# Patient Record
Sex: Female | Born: 1990
Health system: Southern US, Community
[De-identification: ages and names within clinical notes are randomized; demographics above are authoritative.]

## PROBLEM LIST (undated history)

## (undated) DIAGNOSIS — F411 Generalized anxiety disorder: Secondary | ICD-10-CM

## (undated) DIAGNOSIS — T7840XA Allergy, unspecified, initial encounter: Secondary | ICD-10-CM

## (undated) DIAGNOSIS — J45990 Exercise induced bronchospasm: Secondary | ICD-10-CM

## (undated) DIAGNOSIS — N946 Dysmenorrhea, unspecified: Secondary | ICD-10-CM

## (undated) DIAGNOSIS — F909 Attention-deficit hyperactivity disorder, unspecified type: Secondary | ICD-10-CM

## (undated) HISTORY — DX: Allergy, unspecified, initial encounter: T78.40XA

## (undated) HISTORY — PX: TYMPANOSTOMY: SHX2586

## (undated) HISTORY — PX: OTHER SURGICAL HISTORY: SHX169

## (undated) HISTORY — DX: Attention-deficit hyperactivity disorder, unspecified type: F90.9

## (undated) HISTORY — PX: ADENOIDECTOMY: SUR15

## (undated) HISTORY — DX: Dysmenorrhea, unspecified: N94.6

## (undated) HISTORY — DX: Exercise induced bronchospasm: J45.990

## (undated) HISTORY — DX: Generalized anxiety disorder: F41.1

## (undated) HISTORY — PX: FRACTURE SURGERY: SHX138

---
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
## 2013-02-04 ENCOUNTER — Encounter (HOSPITAL_COMMUNITY): Payer: Self-pay

## 2013-02-04 ENCOUNTER — Emergency Department (HOSPITAL_COMMUNITY)
Admission: EM | Admit: 2013-02-04 | Discharge: 2013-02-04 | Disposition: A | Discharge status: Home / Self Care | Payer: Self-pay | Source: Home / Self Care

## 2013-02-04 ENCOUNTER — Emergency Department (INDEPENDENT_AMBULATORY_CARE_PROVIDER_SITE_OTHER): Payer: 59

## 2013-02-04 DIAGNOSIS — R0789 Other chest pain: Secondary | ICD-10-CM

## 2013-02-04 DIAGNOSIS — R071 Chest pain on breathing: Secondary | ICD-10-CM

## 2013-02-04 DIAGNOSIS — M940 Chondrocostal junction syndrome [Tietze]: Secondary | ICD-10-CM

## 2013-02-04 MED ORDER — HYDROCODONE-ACETAMINOPHEN 5-325 MG PO TABS: 1.0000 | ORAL_TABLET | ORAL | Status: DC | PRN
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
## 2013-02-06 NOTE — ED Provider Notes (Signed)
Medical screening examination/treatment/procedure(s) were performed by non-physician practitioner and as supervising physician I was immediately available for consultation/collaboration.  Haralambos Yeatts, M.D.  Analiyah Lechuga C Tiny Chaudhary, MD 02/06/13 [id number] 

## 2013-06-15 ENCOUNTER — Ambulatory Visit (INDEPENDENT_AMBULATORY_CARE_PROVIDER_SITE_OTHER): Payer: 59 | Admitting: Family

## 2013-06-15 ENCOUNTER — Encounter: Payer: Self-pay | Admitting: Family

## 2013-06-15 VITALS — BP 120/80 | HR 69 | Ht 67.0 in | Wt 143.0 lb

## 2013-06-15 DIAGNOSIS — Z0000 Encounter for general adult medical examination without abnormal findings: Secondary | ICD-10-CM

## 2013-06-15 LAB — COMPREHENSIVE METABOLIC PANEL
Albumin: 4 g/dL (ref 3.5–5.2)
Alkaline Phosphatase: 44 U/L (ref 39–117)
BUN: 15 mg/dL (ref 6–23)
CO2: 27 mEq/L (ref 19–32)
GFR: 91.42 mL/min (ref >60.00)
Glucose, Bld: 83 mg/dL (ref 70–99)
Total Bilirubin: 0.7 mg/dL (ref 0.3–1.2)

## 2013-06-15 LAB — CBC WITH DIFFERENTIAL/PLATELET
Basophils Relative: 0.6 % (ref 0.0–3.0)
Eosinophils Absolute: 0.1 10*3/uL (ref 0.0–0.7)
Eosinophils Relative: 2.5 % (ref 0.0–5.0)
HCT: 38.4 % (ref 36.0–46.0)
Hemoglobin: 12.7 g/dL (ref 12.0–15.0)
MCHC: 33.1 g/dL (ref 30.0–36.0)
MCV: 89 fl (ref 78.0–100.0)
Monocytes Absolute: 0.4 10*3/uL (ref 0.1–1.0)
Neutro Abs: 2.2 10*3/uL (ref 1.4–7.7)
RBC: 4.31 Mil/uL (ref 3.87–5.11)

## 2013-06-15 LAB — POCT URINALYSIS DIPSTICK
Bilirubin, UA: NEGATIVE
Blood, UA: NEGATIVE
Glucose, UA: NEGATIVE
Ketones, UA: NEGATIVE
Spec Grav, UA: 1.015
Urobilinogen, UA: 0.2

## 2013-06-15 NOTE — Progress Notes (Signed)
  Subjective:    Patient ID: Katherine Wise, female    DOB: Sep 26, 1991, 22 y.o.   MRN: [id number]  HPI 22 year old white female, a patient it practices in for complete physical exam. Denies any concerns. 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 She is considering going into [employer]. She sees gynecology for Pap and pelvic exams.   Review of Systems  Constitutional: Negative.   HENT: Negative.   Eyes: Negative.   Respiratory: Negative.   Cardiovascular: Negative.   Gastrointestinal: Negative.   Endocrine: Negative.   Genitourinary: Negative.   Musculoskeletal: Negative.   Skin: Negative.   Allergic/Immunologic: Negative.   Neurological: Negative.   Hematological: Negative.   Psychiatric/Behavioral: Negative.    History reviewed. No pertinent past medical history.  History   Social History  . Marital Status: Single    Spouse Name: N/A    Number of Children: N/A  . Years of Education: N/A   Occupational History  . Not on file.   Social History Main Topics  . Smoking status: Never Smoker   . Smokeless tobacco: Not on file  . Alcohol Use: Yes  . Drug Use: No  . Sexually Active: Not on file   Other Topics Concern  . Not on file   Social History Narrative  . No narrative on file    Past Surgical History  Procedure Laterality Date  . Adenoidectomy    . Ankle sugery      Family History  Problem Relation Age of Onset  . Cancer Maternal Aunt     breast  . Cancer Maternal Grandmother     breast    No Known Allergies  Current Outpatient Prescriptions on File Prior to Visit  Medication Sig Dispense Refill  . HYDROcodone-acetaminophen (NORCO/VICODIN) 5-325 MG per tablet Take 1 tablet by mouth every 4 (four) hours as needed for pain.  10 tablet  0   No current facility-administered medications on file prior to visit.    BP 120/80  Pulse 69  Ht 5\' 7"  (1.702 m)  Wt 143 lb (64.864 kg)  BMI 22.39 kg/m2  SpO2  98%chart    Objective:   Physical Exam  Constitutional: She is oriented to person, place, and time. She appears well-developed and well-nourished.  HENT:  Head: Normocephalic.  Right Ear: External ear normal.  Left Ear: External ear normal.  Nose: Nose normal.  Mouth/Throat: Oropharynx is clear and moist.  Eyes: Conjunctivae are normal. Pupils are equal, round, and reactive to light.  Neck: Normal range of motion. Neck supple. No thyromegaly present.  Cardiovascular: Normal rate, regular rhythm and normal heart sounds.   Pulmonary/Chest: Effort normal and breath sounds normal.  Abdominal: Soft. Bowel sounds are normal.  Musculoskeletal: Normal range of motion.  Neurological: She is alert and oriented to person, place, and time. She has normal reflexes.  Skin: Skin is warm and dry.  Psychiatric: She has a normal mood and affect.          Assessment & Plan:  Assessment: 1. Complete physical exam  Plan: I asked him to include CBC, TSH, BMP, urine poc. Call the office with any questions of concerns. Recheck pending labs and sooner as needed

## 2013-06-15 NOTE — Patient Instructions (Signed)
Breast Self-Examination You should begin examining your breasts at age 20 even though the risk for breast cancer is low in this age group. It is important to become familiar with how your breasts look and feel. This is true for pregnant women, nursing mothers, women in menopause and women who have breast implants.  Women should examine their breasts once a month to look for changes and lumps. By doing monthly breast exams, you get to know how your breasts feel and how they can change from month to month. This allows you to pick up changes early. It can also offer you some reassurance that your breast health is good. This exam only takes minutes. Most breast lumps are not caused by cancer. If you find a lump, a special x-ray called a mammogram, or other tests may be needed to determine what is wrong.  Some of the signs that a breast lump is caused by cancer include:  Dimpling of the skin or changes in the shape of the breast or nipple.   A dark-colored or bloody discharge from the nipple.   Swollen lymph glands around the breast or in the armpit.   Redness of the breast or nipple.   Scaly nipple or skin on the breast.   Pain or swelling of the breast.  SELF-EXAM There are a few points to follow when doing a thorough breast exam. The best time to examine your breasts is 5 to 7 days after the menstrual period is over. During menstruation, the breasts are lumpier, and it may be more difficult to pick up changes. If you do not menstruate, have reached menopause or had a hysterectomy, examine your breasts the first day of every month. After three to four months, you will become more familiar with the variations of your breasts and more comfortable with the exam.  Perform your breast exam monthly. Keep a written record with breast changes or normal findings for each breast. This makes it easier to be sure of changes and to not solely depend on memory for size, tenderness, or location. Try to do the exam  at the same time each month, and write down where you are in your menstrual cycle if you are still menstruating.   Look at your breasts. Stand in front of a mirror with your hands clasped behind your head. Tighten your chest muscles and look for asymmetry. This means a difference in shape or contour from one breast to the other, such as puckers, dips or bumps. Look also for skin changes.   Lean forward with your hands on your hips. Again, look for symmetry and skin changes.   While showering, soap the breasts, and carefully feel the breasts with fingertips while holding the arm (on the side of the breast being examined) over the head. Do this with each breast carefully feeling for lumps or changes. Typically, a circular motion with moderate fingertip pressure should be used.   Repeat this exam while lying on your back, again with your arm behind your head and a pillow under your shoulders. Again, use your fingertips to examine both breasts, feeling for lumps and thickening. Begin at 1 o'clock and go clockwise around the whole breast.   At the end of your exam, gently squeeze each nipple to see if there is any drainage. Look for nipple changes, dimpling or redness.   Lastly, examine the upper chest and clavicle areas and in your armpits.  It is not necessary to become alarmed if you find   a breast lump. Most of them are not cancerous. However, it is necessary to see your caregiver if a lump is found in order to have it looked at. Document Released: 12/16/2004 Document Revised: 07/21/2011 Document Reviewed: 02/25/2009 ExitCare Patient Information 2012 [employer]. 

## 2013-09-25 ENCOUNTER — Ambulatory Visit (INDEPENDENT_AMBULATORY_CARE_PROVIDER_SITE_OTHER): Payer: 59

## 2013-09-25 DIAGNOSIS — Z23 Encounter for immunization: Secondary | ICD-10-CM

## 2013-09-25 DIAGNOSIS — Z111 Encounter for screening for respiratory tuberculosis: Secondary | ICD-10-CM

## 2013-09-26 ENCOUNTER — Other Ambulatory Visit: Payer: Self-pay | Admitting: Family

## 2013-09-26 DIAGNOSIS — Z20828 Contact with and (suspected) exposure to other viral communicable diseases: Secondary | ICD-10-CM

## 2013-09-27 ENCOUNTER — Other Ambulatory Visit (INDEPENDENT_AMBULATORY_CARE_PROVIDER_SITE_OTHER): Payer: 59

## 2013-09-27 ENCOUNTER — Encounter: Payer: Self-pay | Admitting: Family

## 2013-09-27 ENCOUNTER — Ambulatory Visit: Payer: 59

## 2013-09-27 DIAGNOSIS — Z20828 Contact with and (suspected) exposure to other viral communicable diseases: Secondary | ICD-10-CM

## 2013-09-27 DIAGNOSIS — Z23 Encounter for immunization: Secondary | ICD-10-CM

## 2013-09-27 LAB — TB SKIN TEST
Induration: 0 mm
TB Skin Test: NEGATIVE

## 2013-09-28 LAB — MEASLES/MUMPS/RUBELLA IMMUNITY
Mumps IgG: 5.79 AU/mL (ref <9.00)
Rubella: 4.66 Index — ABNORMAL HIGH (ref <0.90)

## 2013-09-28 LAB — VARICELLA ZOSTER ANTIBODY, IGM: Varicella Zoster Ab IgM: 0.07 {ISR} (ref <0.91)

## 2013-10-11 ENCOUNTER — Ambulatory Visit (INDEPENDENT_AMBULATORY_CARE_PROVIDER_SITE_OTHER): Payer: 59

## 2013-10-11 ENCOUNTER — Ambulatory Visit: Payer: 59

## 2013-10-11 DIAGNOSIS — Z23 Encounter for immunization: Secondary | ICD-10-CM

## 2013-10-22 ENCOUNTER — Ambulatory Visit (INDEPENDENT_AMBULATORY_CARE_PROVIDER_SITE_OTHER): Payer: 59

## 2013-10-22 DIAGNOSIS — Z111 Encounter for screening for respiratory tuberculosis: Secondary | ICD-10-CM

## 2013-10-24 LAB — TB SKIN TEST: TB Skin Test: NEGATIVE

## 2013-11-09 ENCOUNTER — Ambulatory Visit (INDEPENDENT_AMBULATORY_CARE_PROVIDER_SITE_OTHER): Payer: 59

## 2013-11-09 DIAGNOSIS — Z23 Encounter for immunization: Secondary | ICD-10-CM

## 2014-01-16 ENCOUNTER — Ambulatory Visit: Payer: 59 | Admitting: Family

## 2014-01-17 ENCOUNTER — Ambulatory Visit: Payer: 59 | Admitting: Family

## 2014-01-18 ENCOUNTER — Ambulatory Visit (INDEPENDENT_AMBULATORY_CARE_PROVIDER_SITE_OTHER): Payer: 59 | Admitting: Family

## 2014-01-18 ENCOUNTER — Encounter: Payer: Self-pay | Admitting: Family

## 2014-01-18 VITALS — BP 126/88 | HR 89 | Wt 138.0 lb

## 2014-01-18 DIAGNOSIS — F411 Generalized anxiety disorder: Secondary | ICD-10-CM | POA: Insufficient documentation

## 2014-01-18 DIAGNOSIS — F410 Panic disorder [episodic paroxysmal anxiety] without agoraphobia: Secondary | ICD-10-CM

## 2014-01-18 HISTORY — DX: Generalized anxiety disorder: F41.1

## 2014-01-18 MED ORDER — ESCITALOPRAM OXALATE 10 MG PO TABS: 10.0000 mg | ORAL_TABLET | Freq: Every day | ORAL | Status: DC

## 2014-01-18 NOTE — Progress Notes (Signed)
Pre visit review using our clinic review tool, if applicable. No additional management support is needed unless otherwise documented below in the visit note. 

## 2014-01-18 NOTE — Progress Notes (Signed)
   Subjective:    Patient ID: Katherine Wise, female    DOB: 1991/02/18, 23 y.o.   MRN: [id number]  Anxiety Symptoms include confusion and nervous/anxious behavior.     23 year old white female, nonsmoker presents today with complaints of anxiety and panic attacks. Reports that she has developed anxiety since around age 23 minutes use exercise, meditation, and therapy for relief. She reports getting to a point now she felt that bee stings or not enough. She is a long-standing family history of anxiety, depression, bipolar disorder, and attention deficit disorder. Her symptoms are characterized by nervousness, chest tightness, nausea, tingling in her fingers, dizziness, and decreased concentration.   Review of Systems  Constitutional: Negative.   Respiratory: Negative.   Cardiovascular: Negative.   Genitourinary: Negative.   Musculoskeletal: Negative.   Skin: Negative.   Neurological: Negative.   Psychiatric/Behavioral: Positive for confusion, sleep disturbance and agitation. The patient is nervous/anxious.        Objective:   Physical Exam  Constitutional: She is oriented to person, place, and time. She appears well-developed and well-nourished.  Neck: Normal range of motion. Neck supple.  Cardiovascular: Normal rate, regular rhythm and normal heart sounds.   Pulmonary/Chest: Effort normal and breath sounds normal.  Neurological: She is alert and oriented to person, place, and time.  Skin: Skin is warm and dry.  Psychiatric: She has a normal mood and affect.          Assessment & Plan:  Katherine Wise was seen today for anxiety and panic attack.  Diagnoses and associated orders for this visit:  Generalized anxiety disorder  Other Orders - escitalopram (LEXAPRO) 10 MG tablet; Take 1 tablet (10 mg total) by mouth daily.   Call the office with any questions or concerns. Recheck as scheduled and as needed.

## 2014-01-18 NOTE — Patient Instructions (Signed)
Panic Attacks  Panic attacks are sudden, short-lived surges of severe anxiety, fear, or discomfort. They may occur for no reason when you are relaxed, when you are anxious, or when you are sleeping. Panic attacks may occur for a number of reasons:   · Healthy people occasionally have panic attacks in extreme, life-threatening situations, such as war or natural disasters. Normal anxiety is a protective mechanism of the body that helps us react to danger (fight or flight response).  · Panic attacks are often seen with anxiety disorders, such as panic disorder, social anxiety disorder, generalized anxiety disorder, and phobias. Anxiety disorders cause excessive or uncontrollable anxiety. They may interfere with your relationships or other life activities.  · Panic attacks are sometimes seen with other mental illnesses such as depression and posttraumatic stress disorder.  · Certain medical conditions, prescription medicines, and drugs of abuse can cause panic attacks.  SYMPTOMS   Panic attacks start suddenly, peak within 20 minutes, and are accompanied by four or more of the following symptoms:  · Pounding heart or fast heart rate (palpitations).  · Sweating.  · Trembling or shaking.  · Shortness of breath or feeling smothered.  · Feeling choked.  · Chest pain or discomfort.  · Nausea or strange feeling in your stomach.  · Dizziness, lightheadedness, or feeling like you will faint.  · Chills or hot flushes.  · Numbness or tingling in your lips or hands and feet.  · Feeling that things are not real or feeling that you are not yourself.  · Fear of losing control or going crazy.  · Fear of dying.  Some of these symptoms can mimic serious medical conditions. For example, you may think you are having a heart attack. Although panic attacks can be very scary, they are not life threatening.  DIAGNOSIS   Panic attacks are diagnosed through an assessment by your health care provider. Your health care provider will ask questions  about your symptoms, such as where and when they occurred. Your health care provider will also ask about your medical history and use of alcohol and drugs, including prescription medicines. Your health care provider may order blood tests or other studies to rule out a serious medical condition. Your health care provider may refer you to a mental health professional for further evaluation.  TREATMENT   · Most healthy people who have one or two panic attacks in an extreme, life-threatening situation will not require treatment.  · The treatment for panic attacks associated with anxiety disorders or other mental illness typically involves counseling with a mental health professional, medicine, or a combination of both. Your health care provider will help determine what treatment is best for you.  · Panic attacks due to physical illness usually goes away with treatment of the illness. If prescription medicine is causing panic attacks, talk with your health care provider about stopping the medicine, decreasing the dose, or substituting another medicine.  · Panic attacks due to alcohol or drug abuse goes away with abstinence. Some adults need professional help in order to stop drinking or using drugs.  HOME CARE INSTRUCTIONS   · Take all your medicines as prescribed.    · Check with your health care provider before starting new prescription or over-the-counter medicines.  · Keep all follow up appointments with your health care provider.  SEEK MEDICAL CARE IF:  · You are not able to take your medicines as prescribed.  · Your symptoms do not improve or get worse.  SEEK IMMEDIATE   MEDICAL CARE IF:   · You experience panic attack symptoms that are different than your usual symptoms.  · You have serious thoughts about hurting yourself or others.  · You are taking medicine for panic attacks and have a serious side effect.  MAKE SURE YOU:  · Understand these instructions.  · Will watch your condition.  · Will get help right away  if you are not doing well or get worse.  Document Released: 11/08/2005 Document Revised: 08/29/2013 Document Reviewed: 06/22/2013  ExitCare® Patient Information ©2014 [employer].

## 2014-01-21 ENCOUNTER — Telehealth: Payer: Self-pay | Admitting: Family

## 2014-01-21 ENCOUNTER — Other Ambulatory Visit: Payer: Self-pay

## 2014-01-21 ENCOUNTER — Telehealth (INDEPENDENT_AMBULATORY_CARE_PROVIDER_SITE_OTHER): Payer: 59 | Admitting: Family

## 2014-01-21 DIAGNOSIS — R300 Dysuria: Secondary | ICD-10-CM

## 2014-01-21 LAB — POCT URINALYSIS DIPSTICK
BILIRUBIN UA: NEGATIVE
Glucose, UA: NEGATIVE
KETONES UA: NEGATIVE
PH UA: 6
Spec Grav, UA: 1.025
Urobilinogen, UA: 1

## 2014-01-21 MED ORDER — SULFAMETHOXAZOLE-TMP DS 800-160 MG PO TABS: 1.0000 | ORAL_TABLET | Freq: Two times a day (BID) | ORAL | Status: DC

## 2014-01-21 NOTE — Telephone Encounter (Signed)
Bactrim bid x 7 days, per Padonda.  Pt aware

## 2014-01-21 NOTE — Telephone Encounter (Signed)
Pt advised that Lexapro does not cause UTI's and she will need to come by the office to have a urine dip for confirmation

## 2014-01-21 NOTE — Telephone Encounter (Signed)
Pt on her way to leave urine

## 2014-01-21 NOTE — Telephone Encounter (Signed)
Pt was seen on 01/15/14, pt states that her rxescitalopram (LEXAPRO) 10 MG tablet, has given her a uti and she wants to know if something can be called in for her. Pt states send to [facility].

## 2014-01-21 NOTE — Telephone Encounter (Signed)
Pt would like results of urine specimen asap. Pt states she's in pain.

## 2014-02-07 ENCOUNTER — Telehealth: Payer: Self-pay | Admitting: Family

## 2014-02-07 NOTE — Telephone Encounter (Signed)
lmom for pt to cb for appt,

## 2014-02-07 NOTE — Telephone Encounter (Signed)
Pt needs an appointment. Please call to schedule

## 2014-02-07 NOTE — Telephone Encounter (Signed)
Pt states she possibly has another uti. Pt had one 2 wks ago and is certain its back. We like to come and give urine to poss treat. pls advise

## 2014-02-12 ENCOUNTER — Ambulatory Visit (INDEPENDENT_AMBULATORY_CARE_PROVIDER_SITE_OTHER): Payer: 59 | Admitting: Family

## 2014-02-12 ENCOUNTER — Encounter: Payer: Self-pay | Admitting: Family

## 2014-02-12 VITALS — BP 120/80 | HR 78 | Wt 140.0 lb

## 2014-02-12 DIAGNOSIS — F411 Generalized anxiety disorder: Secondary | ICD-10-CM

## 2014-02-12 DIAGNOSIS — G4700 Insomnia, unspecified: Secondary | ICD-10-CM

## 2014-02-12 MED ORDER — ESCITALOPRAM OXALATE 20 MG PO TABS
20.0000 mg | ORAL_TABLET | Freq: Every day | ORAL | Status: DC
Start: 2014-02-12 — End: 2014-08-11

## 2014-02-12 NOTE — Progress Notes (Signed)
Pre visit review using our clinic review tool, if applicable. No additional management support is needed unless otherwise documented below in the visit note. 

## 2014-02-12 NOTE — Progress Notes (Signed)
   Subjective:    Patient ID: Katherine Wise, female    DOB: [birth year], 23 y.o.   MRN: [id number]  HPI  23 year old white female, thin for recheck of anxiety and insomnia. Reports since being on Lexapro 10 mg, panic attacks have decreased but still feels anxious. She is in school and working swing shift. Has difficulty falling asleep and maintaining sleep. Has been taking Benadryl that helps. Denies any feelings of helplessness, hopelessness, thoughts of death or dying.  Review of Systems  Constitutional: Negative.   Respiratory: Negative.   Cardiovascular: Negative.   Musculoskeletal: Negative.   Skin: Negative.   Neurological: Negative.   Psychiatric/Behavioral: Positive for sleep disturbance. Negative for suicidal ideas and confusion. The patient is nervous/anxious. The patient is not hyperactive.    History reviewed. No pertinent past medical history.  History   Social History  . Marital Status: Single    Spouse Name: N/A    Number of Children: N/A  . Years of Education: N/A   Occupational History  . Not on file.   Social History Main Topics  . Smoking status: Never Smoker   . Smokeless tobacco: Not on file  . Alcohol Use: Yes  . Drug Use: No  . Sexual Activity: Not on file   Other Topics Concern  . Not on file   Social History Narrative  . No narrative on file    Past Surgical History  Procedure Laterality Date  . Adenoidectomy    . Ankle sugery      Family History  Problem Relation Age of Onset  . Cancer Maternal Aunt     breast  . Cancer Maternal Grandmother     breast    No Known Allergies  Current Outpatient Prescriptions on File Prior to Visit  Medication Sig Dispense Refill  . Biotin 5000 MCG CAPS Take 10,000 mcg by mouth.      . Cyanocobalamin 2500 MCG TABS Take by mouth.      . ferrous sulfate 325 (65 FE) MG EC tablet Take 325 mg by mouth daily with breakfast.      . norethindrone-ethinyl estradiol (JUNEL FE,GILDESS FE,LOESTRIN FE) 1-20  MG-MCG tablet Take 1 tablet by mouth daily.      Marland Kitchen. sulfamethoxazole-trimethoprim (BACTRIM DS) 800-160 MG per tablet Take 1 tablet by mouth 2 (two) times daily.  14 tablet  0   No current facility-administered medications on file prior to visit.    BP 120/80  Pulse 78  Wt 140 lb (63.504 kg)chart    Objective:   Physical Exam  Constitutional: She is oriented to person, place, and time. She appears well-developed and well-nourished.  Neck: Normal range of motion. Neck supple.  Cardiovascular: Normal rate, regular rhythm and normal heart sounds.   Pulmonary/Chest: Effort normal and breath sounds normal.  Neurological: She is alert and oriented to person, place, and time.  Skin: Skin is warm and dry.  Psychiatric: She has a normal mood and affect.          Assessment & Plan:  Katherine Wise was seen today for follow-up.  Diagnoses and associated orders for this visit:  Generalized anxiety disorder  Insomnia  Other Orders - escitalopram (LEXAPRO) 20 MG tablet; Take 1 tablet (20 mg total) by mouth daily.   Recheck in 4 weeks and sooner as needed. Benadryl as needed for sleep.

## 2014-02-12 NOTE — Patient Instructions (Addendum)

## 2014-04-12 ENCOUNTER — Ambulatory Visit: Payer: 59

## 2014-04-16 ENCOUNTER — Ambulatory Visit (INDEPENDENT_AMBULATORY_CARE_PROVIDER_SITE_OTHER): Payer: 59

## 2014-04-16 ENCOUNTER — Ambulatory Visit: Payer: 59

## 2014-04-16 ENCOUNTER — Ambulatory Visit: Payer: 59 | Admitting: Family

## 2014-04-16 DIAGNOSIS — Z23 Encounter for immunization: Secondary | ICD-10-CM

## 2014-08-11 ENCOUNTER — Other Ambulatory Visit (HOSPITAL_COMMUNITY)
Admission: RE | Admit: 2014-08-11 | Discharge: 2014-08-11 | Disposition: A | Discharge status: Home / Self Care | Payer: 59 | Source: Ambulatory Visit | Attending: Family Medicine | Admitting: Family Medicine

## 2014-08-11 ENCOUNTER — Emergency Department (HOSPITAL_COMMUNITY)
Admission: EM | Admit: 2014-08-11 | Discharge: 2014-08-11 | Disposition: A | Discharge status: Home / Self Care | Payer: 59 | Source: Home / Self Care

## 2014-08-11 DIAGNOSIS — Z113 Encounter for screening for infections with a predominantly sexual mode of transmission: Secondary | ICD-10-CM | POA: Diagnosis present

## 2014-08-11 DIAGNOSIS — B373 Candidiasis of vulva and vagina: Secondary | ICD-10-CM

## 2014-08-11 DIAGNOSIS — N760 Acute vaginitis: Secondary | ICD-10-CM | POA: Diagnosis present

## 2014-08-11 DIAGNOSIS — N390 Urinary tract infection, site not specified: Secondary | ICD-10-CM

## 2014-08-11 DIAGNOSIS — B3731 Acute candidiasis of vulva and vagina: Secondary | ICD-10-CM

## 2014-08-11 LAB — POCT URINALYSIS DIP (DEVICE)
Bilirubin Urine: NEGATIVE
Glucose, UA: NEGATIVE mg/dL
KETONES UR: NEGATIVE mg/dL
Nitrite: NEGATIVE
PH: 6 (ref 5.0–8.0)
Protein, ur: NEGATIVE mg/dL
SPECIFIC GRAVITY, URINE: 1.01 (ref 1.005–1.030)
Urobilinogen, UA: 0.2 mg/dL (ref 0.0–1.0)

## 2014-08-11 LAB — POCT PREGNANCY, URINE: PREG TEST UR: NEGATIVE

## 2014-08-11 MED ORDER — NYSTATIN 100000 UNIT/GM EX CREA: TOPICAL_CREAM | CUTANEOUS | Status: DC

## 2014-08-11 MED ORDER — CIPROFLOXACIN HCL 500 MG PO TABS: 500.0000 mg | ORAL_TABLET | Freq: Two times a day (BID) | ORAL | Status: DC

## 2014-08-11 MED ORDER — ALIGN 4 MG PO CAPS: 1.0000 | ORAL_CAPSULE | Freq: Every day | ORAL | Status: DC

## 2014-08-11 MED ORDER — MICONAZOLE NITRATE 2 % EX CREA: 1.0000 "application " | TOPICAL_CREAM | Freq: Two times a day (BID) | CUTANEOUS | Status: DC

## 2014-08-11 MED ORDER — FLUCONAZOLE 200 MG PO TABS: ORAL_TABLET | ORAL | Status: DC

## 2014-08-11 MED ORDER — PHENAZOPYRIDINE HCL 200 MG PO TABS: 200.0000 mg | ORAL_TABLET | Freq: Three times a day (TID) | ORAL | Status: DC

## 2014-08-11 NOTE — ED Provider Notes (Signed)
CSN: [id number]     Arrival date & time 08/11/14  0902 History   None    Chief Complaint  Patient presents with  . Dysuria   (Consider location/radiation/quality/duration/timing/severity/associated sxs/prior Treatment)  HPI  Patient is a 23 year old female presenting today with complaints of discomfort and tingling and frequent urinary tract infections. Patient states she has these symptoms a couple of times a year". Patient state her last diagnosis UTI was earlier this year in either March or April. Patient states that she felt like she had an onset of an infection approximately one month ago. Patient states that she has an old prescription for Keflex which she used the sensation treated with Septra in the past and the urinary tract infection was "resistant". In addition the patient states that she is taking a probiotic with cranberry this past week.  Patient reports a history of frequent urinary tract infections of approximately 3-5 times per year. Denies diabetes or other immunocompromise.  Unsure of results of last STI infection testing.  No past medical history on file. Past Surgical History  Procedure Laterality Date  . Adenoidectomy    . Ankle sugery     Family History  Problem Relation Age of Onset  . Cancer Maternal Aunt     breast  . Cancer Maternal Grandmother     breast   History  Substance Use Topics  . Smoking status: Never Smoker   . Smokeless tobacco: Not on file  . Alcohol Use: Yes   OB History   Grav Para Term Preterm Abortions TAB SAB Ect Mult Living                 Review of Systems  Constitutional: Negative.  Negative for fever, chills and fatigue.  HENT: Negative.   Eyes: Negative.   Respiratory: Negative.  Negative for cough and shortness of breath.   Cardiovascular: Negative.   Endocrine: Negative.   Genitourinary: Positive for urgency and pelvic pain. Negative for dysuria, frequency, hematuria, flank pain, vaginal bleeding, vaginal discharge,  difficulty urinating, genital sores and vaginal pain.       The patient describes urethral discomfort, burning and irritation.  Musculoskeletal: Negative.   Skin: Negative.   Allergic/Immunologic: Negative.   Neurological: Negative.   Hematological: Negative.   Psychiatric/Behavioral: Negative.     Allergies  Review of patient's allergies indicates no known allergies.  Home Medications   Prior to Admission medications   Medication Sig Start Date End Date Taking? Authorizing Provider  ALPRAZolam Prudy Feeler) 0.5 MG tablet Take 0.5 mg by mouth 2 (two) times daily as needed for anxiety.   Yes Historical Provider, MD  amphetamine-dextroamphetamine (ADDERALL XR) 10 MG 24 hr capsule Take 10 mg by mouth daily.   Yes Historical Provider, MD  norethindrone-ethinyl estradiol (JUNEL FE,GILDESS FE,LOESTRIN FE) 1-20 MG-MCG tablet Take 1 tablet by mouth daily.   Yes Historical Provider, MD  Probiotic Product (PROBIOTIC PO) Take by mouth daily.   Yes Historical Provider, MD  ciprofloxacin (CIPRO) 500 MG tablet Take 1 tablet (500 mg total) by mouth 2 (two) times daily. 08/11/14   Servando Salina, NP  fluconazole (DIFLUCAN) 200 MG tablet  tablet by mouth on day 1, 3 and 7. 08/11/14   Servando Salina, NP  miconazole (MICOTIN) 2 % cream Apply 1 application topically 2 (two) times daily. 08/11/14   Servando Salina, NP  nystatin cream (MYCOSTATIN) Apply to affected area 3 times daily. 08/11/14   Servando Salina, NP  phenazopyridine (PYRIDIUM) 200  MG tablet Take 1 tablet (200 mg total) by mouth 3 (three) times daily. 08/11/14   Servando Salina, NP  Probiotic Product (ALIGN) 4 MG CAPS Take 1 capsule (4 mg total) by mouth daily. 08/11/14   Servando Salina, NP   BP 136/92  Pulse 75  Temp(Src) 99.8 F (37.7 C) (Oral)  Resp 16  SpO2 97%  LMP 07/25/2014  Physical Exam  Nursing note and vitals reviewed. Constitutional: She is oriented to person, place, and time. She appears well-developed and  well-nourished. No distress.  Cardiovascular: Normal rate, regular rhythm, normal heart sounds and intact distal pulses.  Exam reveals no gallop and no friction rub.   No murmur heard. Pulmonary/Chest: Effort normal and breath sounds normal. No respiratory distress. She has no wheezes. She has no rales. She exhibits no tenderness.  Abdominal: Soft. Bowel sounds are normal. She exhibits no distension and no mass. There is tenderness. There is no rebound and no guarding.  Suprapubic tenderness noted with mild palpation. Negative CVA tenderness.   Genitourinary:     External genital exam completed. Erythema with excoriation noted between the inner aspects of bilateral labia majora and outer aspect of bilateral labia minora consistent with fungal infection.  No evidence of blisters or ulceration.  No discharge or bleeding.   Neurological: She is alert and oriented to person, place, and time.  Skin: Skin is warm and dry. No rash noted. She is not diaphoretic. There is erythema. No pallor.    ED Course  Procedures (including critical care time) Labs Review Labs Reviewed  POCT URINALYSIS DIP (DEVICE) - Abnormal; Notable for the following:    Hgb urine dipstick SMALL (*)    Leukocytes, UA LARGE (*)    All other components within normal limits  URINE CULTURE  POCT PREGNANCY, URINE  URINE CYTOLOGY ANCILLARY ONLY   Results for orders placed during the hospital encounter of 08/11/14  POCT URINALYSIS DIP (DEVICE)      Result Value Ref Range   Glucose, UA NEGATIVE  NEGATIVE mg/dL   Bilirubin Urine NEGATIVE  NEGATIVE   Ketones, ur NEGATIVE  NEGATIVE mg/dL   Specific Gravity, Urine 1.010  1.005 - 1.030   Hgb urine dipstick SMALL (*) NEGATIVE   pH 6.0  5.0 - 8.0   Protein, ur NEGATIVE  NEGATIVE mg/dL   Urobilinogen, UA 0.2  0.0 - 1.0 mg/dL   Nitrite NEGATIVE  NEGATIVE   Leukocytes, UA LARGE (*) NEGATIVE  POCT PREGNANCY, URINE      Result Value Ref Range   Preg Test, Ur NEGATIVE  NEGATIVE    Urine culture pending. Testing for gonorrhea and Chlamydia pending.  Imaging Review No results found.   MDM   1. Vaginal yeast infection   2. UTI (lower urinary tract infection)    Meds ordered this encounter  Medications  . ALPRAZolam (XANAX) 0.5 MG tablet    Sig: Take 0.5 mg by mouth 2 (two) times daily as needed for anxiety.  Marland Kitchen amphetamine-dextroamphetamine (ADDERALL XR) 10 MG 24 hr capsule    Sig: Take 10 mg by mouth daily.  . Probiotic Product (PROBIOTIC PO)    Sig: Take by mouth daily.  . Probiotic Product (ALIGN) 4 MG CAPS    Sig: Take 1 capsule (4 mg total) by mouth daily.    Dispense:  30 capsule    Refill:  2  . ciprofloxacin (CIPRO) 500 MG tablet    Sig: Take 1 tablet (500 mg total) by mouth 2 (two)  times daily.    Dispense:  20 tablet    Refill:  0  . phenazopyridine (PYRIDIUM) 200 MG tablet    Sig: Take 1 tablet (200 mg total) by mouth 3 (three) times daily.    Dispense:  6 tablet    Refill:  0  . fluconazole (DIFLUCAN) 200 MG tablet    Sig:  tablet by mouth on day 1, 3 and 7.    Dispense:  3 tablet    Refill:  0  . miconazole (MICOTIN) 2 % cream    Sig: Apply 1 application topically 2 (two) times daily.    Dispense:  28.35 g    Refill:  0  . nystatin cream (MYCOSTATIN)    Sig: Apply to affected area 3 times daily.    Dispense:  30 g    Refill:  1   Discussed the importance of good genital hygiene. The patient verbalizes understanding and agrees to plan of care.     Servando Salina, NP 08/11/14 1015

## 2014-08-11 NOTE — ED Provider Notes (Signed)
Medical screening examination/treatment/procedure(s) were performed by a resident physician or non-physician practitioner and as the supervising physician I was immediately available for consultation/collaboration.  Evan Corey, MD    Evan S Corey, MD 08/11/14 1306 

## 2014-08-11 NOTE — ED Notes (Signed)
Patient c/o burning, urinary frequency x 1 day

## 2014-08-11 NOTE — Discharge Instructions (Signed)

## 2014-08-12 LAB — URINE CYTOLOGY ANCILLARY ONLY
Chlamydia: NEGATIVE
NEISSERIA GONORRHEA: NEGATIVE

## 2014-08-13 LAB — URINE CULTURE
Colony Count: >=100000
SPECIAL REQUESTS: NORMAL

## 2014-08-14 NOTE — ED Notes (Addendum)
Called , left message for her to call.  Spoke w patient, and after verifying ID, discussed need for completion of Rx as writtten

## 2014-08-16 LAB — URINE CYTOLOGY ANCILLARY ONLY
Bacterial vaginitis: NEGATIVE
CANDIDA VAGINITIS: NEGATIVE

## 2014-08-24 NOTE — ED Notes (Signed)
GC/Chlamydia neg., Candida and Gardnerella neg.  Urine culture: >100,000 colonies E. Coli.  Pt. adequately treated with Cipro. Vassie MoselleYork, Lulie Hurd M 08/24/2014

## 2015-03-18 ENCOUNTER — Other Ambulatory Visit (HOSPITAL_COMMUNITY)
Admission: RE | Admit: 2015-03-18 | Discharge: 2015-03-18 | Disposition: A | Discharge status: Home / Self Care | Payer: 59 | Source: Ambulatory Visit | Attending: Family Medicine | Admitting: Family Medicine

## 2015-03-18 ENCOUNTER — Encounter: Payer: Self-pay | Admitting: Family Medicine

## 2015-03-18 ENCOUNTER — Ambulatory Visit (INDEPENDENT_AMBULATORY_CARE_PROVIDER_SITE_OTHER): Payer: 59 | Admitting: Family Medicine

## 2015-03-18 VITALS — BP 118/68 | HR 103 | Temp 99.0°F | Wt 145.0 lb

## 2015-03-18 DIAGNOSIS — F909 Attention-deficit hyperactivity disorder, unspecified type: Secondary | ICD-10-CM

## 2015-03-18 DIAGNOSIS — Z7251 High risk heterosexual behavior: Secondary | ICD-10-CM

## 2015-03-18 DIAGNOSIS — N760 Acute vaginitis: Secondary | ICD-10-CM | POA: Diagnosis present

## 2015-03-18 DIAGNOSIS — Z23 Encounter for immunization: Secondary | ICD-10-CM | POA: Diagnosis not present

## 2015-03-18 DIAGNOSIS — F411 Generalized anxiety disorder: Secondary | ICD-10-CM

## 2015-03-18 DIAGNOSIS — F988 Other specified behavioral and emotional disorders with onset usually occurring in childhood and adolescence: Secondary | ICD-10-CM | POA: Insufficient documentation

## 2015-03-18 DIAGNOSIS — Z113 Encounter for screening for infections with a predominantly sexual mode of transmission: Secondary | ICD-10-CM | POA: Diagnosis present

## 2015-03-18 DIAGNOSIS — N946 Dysmenorrhea, unspecified: Secondary | ICD-10-CM

## 2015-03-18 DIAGNOSIS — A184 Tuberculosis of skin and subcutaneous tissue: Secondary | ICD-10-CM

## 2015-03-18 HISTORY — DX: Dysmenorrhea, unspecified: N94.6

## 2015-03-18 HISTORY — DX: Attention-deficit hyperactivity disorder, unspecified type: F90.9

## 2015-03-18 NOTE — Progress Notes (Signed)
Pre visit review using our clinic review tool, if applicable. No additional management support is needed unless otherwise documented below in the visit note. 

## 2015-03-18 NOTE — Assessment & Plan Note (Signed)
S: Birth control- Junel improved this.  As of 03/18/15 in homosexual relationship and not active with males-trialing off. Will see if has regular periods. Concerned about libido.  A/P: patient to monitor symptoms but asked patient to return to care if heavy or painful or irregular periods off birth control.

## 2015-03-18 NOTE — Patient Instructions (Addendum)
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

## 2015-03-18 NOTE — Assessment & Plan Note (Signed)
S: follows with Dr. Ceasar Monsard psychiatry. Lawerance BachBarbara Foust for counseling. Xanax 0.5mg  BID prn. Trazodone for sleep SSRI tried (lexapro) and felt "numb" and got speeding ticket and didn't care. Reasonable control.  A/P: Symptoms reasonably well controlled. 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

## 2015-03-18 NOTE — Progress Notes (Signed)
Katherine Conch, MD Phone: [phone]  Subjective:  Patient presents today to establish care with me as their new primary care provider. Patient was formerly a patient of Dr. Oran Rein. Chief complaint-noted.   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 We reviewed her medical history and medical appropriateness.  ROS- No SI/HI. Denies depressive symptoms. Some inattention when medication wears off late in day. No vaginal discharge, unintentional weight loss, fatigue, fevers, nausea.   The following were reviewed and entered/updated in epic: Past Medical History  Diagnosis Date  . Generalized anxiety disorder 01/18/2014    Dr. Ceasar Mons psychiatry. Lawerance Bach for counseling. Xanax 0.5mg  BID prn.  Trazodone for sleep SSRI tried (lexapro) and felt "numb" and got speeding ticket and didn't care   . ADHD (attention deficit hyperactivity disorder) 03/18/2015    Dr. Ceasar Mons psychiatry. ? OCD taking Adderall XR . May be changing due to anxiety   . Dysmenorrhea 03/18/2015    Birth control- Junel improved this.  As of 03/18/15 in homosexual relationship and not active with males-trialing off. Will see if has regular periods. Concerned about libido.    Marland Kitchen Exercise-induced asthma     "grew out of"   Patient Active Problem List   Diagnosis Date Noted  . ADHD (attention deficit hyperactivity disorder) 03/18/2015    Priority: Medium  . Dysmenorrhea 03/18/2015    Priority: Medium  . Generalized anxiety disorder 01/18/2014    Priority: Medium   Past Surgical History  Procedure Laterality Date  . Adenoidectomy    . Ankle sugery    . Tympanostomy      1 set    Family History  Problem Relation Age of Onset  . Cancer Maternal Aunt     breast- late 36s  . Cancer Maternal Grandmother     breast-late 40s  . ADD / ADHD Mother   . Hypertension Father     Medications- reviewed and updated Current Outpatient Prescriptions  Medication Sig Dispense Refill    . ALPRAZolam (XANAX) 0.5 MG tablet Take 0.5 mg by mouth 2 (two) times daily as needed for anxiety.    Marland Kitchen amphetamine-dextroamphetamine (ADDERALL XR) 10 MG 24 hr capsule Take 10 mg by mouth daily.    . norethindrone-ethinyl estradiol (JUNEL FE,GILDESS FE,LOESTRIN FE) 1-20 MG-MCG tablet Take 1 tablet by mouth daily.     Allergies-reviewed and updated No Known Allergies  History   Social History  . Marital Status: Single    Spouse Name: N/A  . Number of Children: N/A  . Years of Education: N/A   Social History Main Topics  . Smoking status: Never Smoker   . Smokeless tobacco: Not on file  . Alcohol Use: 0.6 oz/week    1 Standard drinks or equivalent per week  . Drug Use: No  . Sexual Activity:    Partners: Female, Female   Other Topics Concern  . None   Social History Narrative   Single. Dating.       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    2 years at [employer], 1 year at [employer] basic EMT.       Hobbies: working on it    ROS--See HPI   Objective: BP 118/68 mmHg  Pulse 103  Temp(Src) 99 F (37.2 C)  Wt 145 lb (65.772 kg)  LMP 03/01/2015 Gen: NAD, resting comfortably in chair, repeat manual HR 90 HEENT: Mucous membranes are moist.  Oropharynx normal Neck: no thyromegaly CV: RRR no murmurs rubs or gallops Lungs: CTAB no crackles, wheeze, rhonchi Abdomen: soft/nontender/nondistended/normal bowel sounds. No rebound or guarding.  Ext: no edema Skin: warm, dry, no rash Neuro: 5/5 strength upper and lower extremities, PERRLA, normal reflexes   Assessment/Plan:  Generalized anxiety disorder S: follows with Dr. Ceasar Monsard psychiatry. Lawerance BachBarbara Foust for counseling. Xanax 0.5mg  BID prn. Trazodone for sleep SSRI tried (lexapro) and felt "numb" and got speeding ticket and didn't care. Reasonable control.  A/P: Symptoms reasonably well controlled. Believe reasonable to move forward with nursing as long as continues under  psychiatric care and she plans to     ADHD (attention deficit hyperactivity disorder) S:Dr. Card psychiatry. ? OCD taking Adderall XR 20mg . Reasonable control A/P: stimulants can contribute to anxiety and to lessen anxiety, considering switching classes. Consider psychiatry care.     Dysmenorrhea S: Birth control- Junel improved this.  As of 03/18/15 in homosexual relationship and not active with males-trialing off. Will see if has regular periods. Concerned about libido.  A/P: patient to monitor symptoms but asked patient to return to care if heavy or painful or irregular periods off birth control.     S: Unprotected sex within last year with 1 female partner outside regular female partner A/P: check for stds, advised condoms -female or female partners  Return precautions advised. Form completed-believe safe to proceed forward with nursing as long as PPD normal, received final varicella today   Orders Placed This Encounter  Procedures  . Varicella vaccine subcutaneous  . HIV antibody    [facility]  . [facility]    [facility]  . [facility]    Order Specific Question:  Has patient ever tested positive?    Answer:  No

## 2015-03-18 NOTE — Assessment & Plan Note (Signed)
S:Dr. Ceasar Monsard psychiatry. ? OCD taking Adderall XR 20mg . Reasonable control A/P: stimulants can contribute to anxiety and to lessen anxiety, considering switching classes. Consider psychiatry care.

## 2015-03-19 LAB — HIV ANTIBODY (ROUTINE TESTING W REFLEX): HIV 1&2 Ab, 4th Generation: NONREACTIVE

## 2015-03-19 LAB — RPR

## 2015-03-19 LAB — URINE CYTOLOGY ANCILLARY ONLY
Chlamydia: NEGATIVE
NEISSERIA GONORRHEA: NEGATIVE
Trichomonas: NEGATIVE

## 2015-03-20 LAB — TB SKIN TEST
INDURATION: 0 mm
TB Skin Test: NEGATIVE

## 2015-04-16 ENCOUNTER — Ambulatory Visit (INDEPENDENT_AMBULATORY_CARE_PROVIDER_SITE_OTHER): Payer: 59 | Admitting: Family Medicine

## 2015-04-16 DIAGNOSIS — Z111 Encounter for screening for respiratory tuberculosis: Secondary | ICD-10-CM

## 2015-04-18 LAB — TB SKIN TEST
Induration: 0 mm
TB Skin Test: NEGATIVE

## 2015-07-07 ENCOUNTER — Other Ambulatory Visit: Payer: Self-pay | Admitting: Obstetrics and Gynecology

## 2015-07-08 LAB — CYTOLOGY - PAP

## 2015-11-28 ENCOUNTER — Emergency Department (INDEPENDENT_AMBULATORY_CARE_PROVIDER_SITE_OTHER): Payer: 59

## 2015-11-28 ENCOUNTER — Encounter: Payer: Self-pay | Admitting: *Deleted

## 2015-11-28 ENCOUNTER — Emergency Department (INDEPENDENT_AMBULATORY_CARE_PROVIDER_SITE_OTHER)
Admission: EM | Admit: 2015-11-28 | Discharge: 2015-11-28 | Disposition: A | Discharge status: Home / Self Care | Payer: 59 | Source: Home / Self Care | Attending: Family Medicine | Admitting: Family Medicine

## 2015-11-28 DIAGNOSIS — M25571 Pain in right ankle and joints of right foot: Secondary | ICD-10-CM

## 2015-11-28 DIAGNOSIS — S9001XA Contusion of right ankle, initial encounter: Secondary | ICD-10-CM

## 2015-11-28 NOTE — ED Notes (Signed)
Pt c/o injuring right ankle on elliptical machine pedal. Previous injury with surgery and hardware.

## 2015-11-28 NOTE — Discharge Instructions (Signed)
Apply ice pack for 30 minutes every 3 to 4 hours today and tomorrow.  Elevate.  Wear Ace wrap until swelling decreases.   Begin range of motion and stretching exercises in about 5 days as per instruction sheet.  May take Ibuprofen 200mg , 4 tabs every 8 hours with food.    Contusion A contusion is a deep bruise. Contusions are the result of a blunt injury to tissues and muscle fibers under the skin. The injury causes bleeding under the skin. The skin overlying the contusion may turn blue, purple, or yellow. Minor injuries will give you a painless contusion, but more severe contusions may stay painful and swollen for a few weeks.  CAUSES  This condition is usually caused by a blow, trauma, or direct force to an area of the body. SYMPTOMS  Symptoms of this condition include:  Swelling of the injured area.  Pain and tenderness in the injured area.  Discoloration. The area may have redness and then turn blue, purple, or yellow. DIAGNOSIS  This condition is diagnosed based on a physical exam and medical history. An X-ray, CT scan, or MRI may be needed to determine if there are any associated injuries, such as broken bones (fractures). TREATMENT  Specific treatment for this condition depends on what area of the body was injured. In general, the best treatment for a contusion is resting, icing, applying pressure to (compression), and elevating the injured area. This is often called the RICE strategy. Over-the-counter anti-inflammatory medicines may also be recommended for pain control.  HOME CARE INSTRUCTIONS   Rest the injured area.  If directed, apply ice to the injured area:  Put ice in a plastic bag.  Place a towel between your skin and the bag.  Leave the ice on for 20 minutes, 2-3 times per day.  If directed, apply light compression to the injured area using an elastic bandage. Make sure the bandage is not wrapped too tightly. Remove and reapply the bandage as directed by your health  care provider.  If possible, raise (elevate) the injured area above the level of your heart while you are sitting or lying down.  Take over-the-counter and prescription medicines only as told by your health care provider. SEEK MEDICAL CARE IF:  Your symptoms do not improve after several days of treatment.  Your symptoms get worse.  You have difficulty moving the injured area. SEEK IMMEDIATE MEDICAL CARE IF:   You have severe pain.  You have numbness in a hand or foot.  Your hand or foot turns pale or cold.   This information is not intended to replace advice given to you by your health care provider. Make sure you discuss any questions you have with your health care provider.   Document Released: 08/18/2005 Document Revised: 07/30/2015 Document Reviewed: 03/26/2015 Elsevier Interactive Patient Education [employer]2016 [employer]

## 2015-11-28 NOTE — ED Provider Notes (Signed)
CSN: [id number]     Arrival date & time 11/28/15  1758 History   First MD Initiated Contact with Patient 11/28/15 1822     Chief Complaint  Patient presents with  . Ankle Pain      HPI Comments: While climbing off an elliptical exerciser yesterday, patient scraped/bumped her right foot ankle medially on the pedal.  She had immediate swelling that improved with application of ice, but pain persists.  She has a past history of fracture of her right medial malleolus with ORIF.  Patient is a 25 y.o. female presenting with ankle pain. The history is provided by the patient.  Ankle Pain Location:  Ankle Time since incident:  1 day Injury: yes   Mechanism of injury comment:  Contusion Ankle location:  R ankle Pain details:    Quality:  Aching   Radiates to:  Does not radiate   Severity:  Mild   Onset quality:  Sudden   Duration:  1 day   Timing:  Constant   Progression:  Improving Chronicity:  New Prior injury to area:  Yes Relieved by:  Ice Worsened by:  Bearing weight and activity Ineffective treatments:  Ice Associated symptoms: stiffness and swelling   Associated symptoms: no decreased ROM, no muscle weakness, no numbness and no tingling     Past Medical History  Diagnosis Date  . Generalized anxiety disorder 01/18/2014    Dr. Ceasar Monsard psychiatry. Lawerance BachBarbara Foust for counseling. Xanax 0.5mg  BID prn.  Trazodone for sleep SSRI tried (lexapro) and felt "numb" and got speeding ticket and didn't care   . ADHD (attention deficit hyperactivity disorder) 03/18/2015    Dr. Ceasar Monsard psychiatry. ? OCD taking Adderall XR 20mg . May be changing due to anxiety   . Dysmenorrhea 03/18/2015    Birth control- Junel improved this.  As of 03/18/15 in homosexual relationship and not active with males-trialing off. Will see if has regular periods. Concerned about libido.    Marland Kitchen. Exercise-induced asthma     "grew out of"   Past Surgical History  Procedure Laterality Date  . Adenoidectomy    . Ankle sugery    .  Tympanostomy      1 set   Family History  Problem Relation Age of Onset  . Cancer Maternal Aunt     breast- late 6540s  . Cancer Maternal Grandmother     breast-late 40s  . ADD / ADHD Mother   . Hypertension Father    Social History  Substance Use Topics  . Smoking status: Never Smoker   . Smokeless tobacco: None  . Alcohol Use: 0.6 oz/week    1 Standard drinks or equivalent per week   OB History    No data available     Review of Systems  Musculoskeletal: Positive for stiffness.  All other systems reviewed and are negative.   Allergies  Review of patient's allergies indicates no known allergies.  Home Medications   Prior to Admission medications   Medication Sig Start Date End Date Taking? Authorizing Provider  ALPRAZolam Prudy Feeler(XANAX) 0.5 MG tablet Take 0.5 mg by mouth 2 (two) times daily as needed for anxiety.    Historical Provider, MD  norethindrone-ethinyl estradiol (JUNEL FE,GILDESS FE,LOESTRIN FE) 1-20 MG-MCG tablet Take 1 tablet by mouth daily.    Historical Provider, MD   Meds Ordered and Administered this Visit  Medications - No data to display  BP 128/88 mmHg  Pulse 74  Resp 14  Ht 5' 7.5" (1.715 m)  Wt 154  lb (69.854 kg)  BMI 23.75 kg/m2  SpO2 100%  LMP 11/24/2015 No data found.   Physical Exam  Constitutional: She is oriented to person, place, and time. She appears well-developed and well-nourished. No distress.  HENT:  Head: Atraumatic.  Pulmonary/Chest: No respiratory distress.  Musculoskeletal:       Right ankle: She exhibits decreased range of motion, swelling and ecchymosis. She exhibits no deformity, no laceration and normal pulse. Tenderness.       Feet:  There is tenderness to palpation over the medial aspect of right ankle as noted on diagram.  There is mild swelling and minimal ecchymosis.  There is tenderness to palpation over the posterior tibial tendon.    Neurological: She is alert and oriented to person, place, and time.  Skin: Skin  is warm and dry.  Nursing note and vitals reviewed.   ED Course  Procedures  None   Imaging Review Dg Ankle Complete Right  11/28/2015  CLINICAL DATA:  Pt states she was at the gym yesterday on the elliptical machine and the foot pedal hit her right ankle. C/o medial pain with bruising. Hx of sx to right ankle in 2009. EXAM: RIGHT ANKLE - COMPLETE 3+ VIEW COMPARISON:  None. FINDINGS: No acute fracture. Two screws have been inserted across the medial malleolus into the distal tibial metaphysis. The orthopedic hardware is well-seated and aligned. The old fracture is well healed and no longer evident. Ankle mortise is normally spaced and aligned. Soft tissues are unremarkable. IMPRESSION: No acute fracture or dislocation. Electronically Signed   By: Amie Portland M.D.   On: 11/28/2015 18:46      MDM   1. Contusion of right ankle, initial encounter; note tenderness over posterior tibial tendon     Ace wrap applied. Apply ice pack for 30 minutes every 3 to 4 hours today and tomorrow.  Elevate.  Wear Ace wrap until swelling decreases.   Begin range of motion and stretching exercises in about 5 days as per instruction sheet.  May take Ibuprofen 200mg , 4 tabs every 8 hours with food.  Followup with Dr. Rodney Langton or Dr. Clement[facility] ([facility]) if not improving about two weeks.     Lattie Haw, MD 12/02/15 [phone]

## 2015-12-10 MED FILL — PROPRANOLOL 20 MG TABLET: 20 | 60 days supply | Qty: 180 | Fill #0

## 2015-12-26 MED FILL — NORETHIND-ETH ESTRAD 1-0.02: 1-20 | 84 days supply | Qty: 63 | Fill #2

## 2016-01-16 ENCOUNTER — Ambulatory Visit (INDEPENDENT_AMBULATORY_CARE_PROVIDER_SITE_OTHER): Payer: 59 | Admitting: Family Medicine

## 2016-01-16 ENCOUNTER — Encounter: Payer: Self-pay | Admitting: Family Medicine

## 2016-01-16 VITALS — BP 116/70 | HR 97 | Temp 98.5°F | Wt 156.0 lb

## 2016-01-16 DIAGNOSIS — F411 Generalized anxiety disorder: Secondary | ICD-10-CM

## 2016-01-16 DIAGNOSIS — J45909 Unspecified asthma, uncomplicated: Secondary | ICD-10-CM | POA: Insufficient documentation

## 2016-01-16 DIAGNOSIS — J309 Allergic rhinitis, unspecified: Secondary | ICD-10-CM | POA: Insufficient documentation

## 2016-01-16 DIAGNOSIS — J3089 Other allergic rhinitis: Secondary | ICD-10-CM

## 2016-01-16 DIAGNOSIS — J4530 Mild persistent asthma, uncomplicated: Secondary | ICD-10-CM | POA: Diagnosis not present

## 2016-01-16 MED ORDER — BECLOMETHASONE DIPROPIONATE 80 MCG/ACT IN AERS: 2.0000 | INHALATION_SPRAY | Freq: Two times a day (BID) | RESPIRATORY_TRACT | Status: DC

## 2016-01-16 MED ORDER — ALBUTEROL SULFATE HFA 108 (90 BASE) MCG/ACT IN AERS: 2.0000 | INHALATION_SPRAY | Freq: Four times a day (QID) | RESPIRATORY_TRACT | Status: DC | PRN

## 2016-01-16 MED FILL — VENTOLIN HFA 90 MCG INHALER: 108 (90 BAS | 25 days supply | Qty: 18 | Fill #0

## 2016-01-16 MED FILL — QVAR 80 MCG ORAL INHALER: 80 | 30 days supply | Qty: 9 | Fill #0

## 2016-01-16 NOTE — Assessment & Plan Note (Signed)
S: Felt numb on SSRI. Palpitations high probability linked to anxiety but does not want to retrial ssri. On xanax through psychiatry. Adderrall also probably worsens her anxiety and palpitations but states cannot function without it A/P: difficult cycle here- uncontrolled anxiety as does not tolerate SSRI but also on adderrall which worsens anxiety and causes palpitations likely- then has to use propranolol to control those symptoms which may worsen asthma. Meds prescribed by psych- advised that we try to reduce this cycle instead of using additional medication to patch each problem but patient declines.

## 2016-01-16 NOTE — Progress Notes (Signed)
Tana Conch, MD  Subjective:  Katherine Wise is a 25 y.o. year old very pleasant female patient who presents for/with See problem oriented charting ROS- palpitations noted. Shortness of breatha nd chest tightness associated with wheeze when running.   Past Medical History-  Patient Active Problem List   Diagnosis Date Noted  . ADHD (attention deficit hyperactivity disorder) 03/18/2015    Priority: Medium  . Dysmenorrhea 03/18/2015    Priority: Medium  . Generalized anxiety disorder 01/18/2014    Priority: Medium  . Asthma 01/16/2016    Medications- reviewed and updated Current Outpatient Prescriptions  Medication Sig Dispense Refill  . ALPRAZolam (XANAX) 0.5 MG tablet Take 0.5 mg by mouth 2 (two) times daily as needed for anxiety.    Marland Kitchen amphetamine-dextroamphetamine (ADDERALL XR) 20 MG 24 hr capsule Take 20 mg by mouth daily.    . norethindrone-ethinyl estradiol (JUNEL FE,GILDESS FE,LOESTRIN FE) 1-20 MG-MCG tablet Take 1 tablet by mouth daily.    . propranolol (INDERAL) 20 MG tablet Take 20 mg by mouth 3 (three) times daily.    Marland Kitchen albuterol (PROVENTIL HFA;VENTOLIN HFA) 108 (90 Base) MCG/ACT inhaler Inhale 2 puffs into the lungs every 6 (six) hours as needed for wheezing or shortness of breath. 1 Inhaler 5  . beclomethasone (QVAR) 80 MCG/ACT inhaler Inhale 2 puffs into the lungs 2 (two) times daily. 1 Inhaler 12   No current facility-administered medications for this visit.    Objective: BP 116/70 mmHg  Pulse 97  Temp(Src) 98.5 F (36.9 C)  Wt 156 lb (70.761 kg) Gen: NAD, resting comfortably CV: RRR no murmurs rubs or gallops Lungs: CTAB no crackles, wheeze, rhonchi Ext: no edema Skin: warm, dry Neuro: grossly normal, moves all extremities Psychiatry: anxious appearing  Assessment/Plan:  Asthma Allergic Rhinitis S: previously patient described "exercise induced asthma" as child that had been doing much better recently. Today, she admits that is not really the case-  she has been having to use albuterol before any exercise (using family members). 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 When she runs, she feels more short of breath- trying to pull her shirt off her chest to relieve it which of course does not work. Used albuterol before activity and that helps significantly. Much more carpet in this household than prior and admits allergies have increased including runny nose and watery itchy eyes.  Has tried zyrtec/allegra/flonase. Nose bleeds on flonase. Has tried singular in past and did not seem to help A/P: Patient is interested in maintenance inhaler- Start qvar  2 puffs BID. Given her own albuterol inhaler to use as rescue. She also has been started on propranolol per psychiatry for palpitations/anxiety and this could have worsened symptoms- would consider more beta selective- she is not interested in a change today   Generalized anxiety disorder S: Felt numb on SSRI. Palpitations high probability linked to anxiety but does not want to retrial ssri. On xanax through psychiatry. Adderrall also probably worsens her anxiety and palpitations but states cannot function without it A/P: difficult cycle here- uncontrolled anxiety as does not tolerate SSRI but also on adderrall which worsens anxiety and causes palpitations likely- then has to use propranolol to control those symptoms which may worsen asthma. Meds prescribed by psych- advised that we try to reduce this cycle instead of using additional medication to patch each problem but patient declines.    Allergic rhinitis See above- with allergic issues difficult to control- she is  interested in seeing allergist- will reach out if she opts for this   6-8 weeks Return precautions advised.   Meds ordered this encounter  Medications  . amphetamine-dextroamphetamine (ADDERALL XR) 20 MG 24 hr capsule    Sig: Take 20 mg by mouth daily.  . propranolol  (INDERAL) 20 MG tablet    Sig: Take 20 mg by mouth 3 (three) times daily.  . beclomethasone (QVAR) 80 MCG/ACT inhaler    Sig: Inhale 2 puffs into the lungs 2 (two) times daily.    Dispense:  1 Inhaler    Refill:  12  . albuterol (PROVENTIL HFA;VENTOLIN HFA) 108 (90 Base) MCG/ACT inhaler    Sig: Inhale 2 puffs into the lungs every 6 (six) hours as needed for wheezing or shortness of breath.    Dispense:  1 Inhaler    Refill:  5   The duration of face-to-face time during this visit was 25 minutes. Greater than 50% of this time was spent in counseling, explanation of diagnosis, planning of further management, and/or coordination of care.

## 2016-01-16 NOTE — Assessment & Plan Note (Signed)
Allergic Rhinitis S: previously patient described "exercise induced asthma" as child that had been doing much better recently. Today, she admits that is not really the case- she has been having to use albuterol before any exercise (using family members). 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 When she runs, she feels more short of breath- trying to pull her shirt off her chest to relieve it which of course does not work. Used albuterol before activity and that helps significantly. Much more carpet in this household than prior and admits allergies have increased including runny nose and watery itchy eyes.  Has tried zyrtec/allegra/flonase. Nose bleeds on flonase. Has tried singular in past and did not seem to help A/P: Patient is interested in maintenance inhaler- Start qvar  2 puffs BID. Given her own albuterol inhaler to use as rescue. She also has been started on propranolol per psychiatry for palpitations/anxiety and this could have worsened symptoms- would consider more beta selective- she is not interested in a change today

## 2016-01-16 NOTE — Assessment & Plan Note (Signed)
See above- with allergic issues difficult to control- she is interested in seeing allergist- will reach out if she opts for this

## 2016-01-16 NOTE — Patient Instructions (Signed)
Let's start you on qvar 2 puffs twice a day for at least 4 weeks.  Also filled albuterol-you may want to still use pre-exercise for at least next week  If you are doing really well at 4 weeks, can trial 1 puff twice a day instead.   See me in 6-8 weeks. Happy to see you sooner if needed

## 2016-02-27 ENCOUNTER — Encounter: Payer: Self-pay | Admitting: Family Medicine

## 2016-02-27 ENCOUNTER — Other Ambulatory Visit (HOSPITAL_COMMUNITY)
Admission: RE | Admit: 2016-02-27 | Discharge: 2016-02-27 | Disposition: A | Discharge status: Home / Self Care | Payer: 59 | Source: Ambulatory Visit | Attending: Family Medicine | Admitting: Family Medicine

## 2016-02-27 ENCOUNTER — Ambulatory Visit (INDEPENDENT_AMBULATORY_CARE_PROVIDER_SITE_OTHER): Payer: 59 | Admitting: Family Medicine

## 2016-02-27 VITALS — BP 110/70 | HR 73 | Temp 98.4°F | Wt 154.0 lb

## 2016-02-27 DIAGNOSIS — N760 Acute vaginitis: Secondary | ICD-10-CM | POA: Insufficient documentation

## 2016-02-27 DIAGNOSIS — Z113 Encounter for screening for infections with a predominantly sexual mode of transmission: Secondary | ICD-10-CM | POA: Insufficient documentation

## 2016-02-27 DIAGNOSIS — J3089 Other allergic rhinitis: Secondary | ICD-10-CM

## 2016-02-27 DIAGNOSIS — J4530 Mild persistent asthma, uncomplicated: Secondary | ICD-10-CM | POA: Diagnosis not present

## 2016-02-27 DIAGNOSIS — Z7251 High risk heterosexual behavior: Secondary | ICD-10-CM

## 2016-02-27 MED FILL — traZODone HCL 50 MG TABS: 50 | 90 days supply | Qty: 270 | Fill #0

## 2016-02-27 MED FILL — DEXTROAMP-AMPHET ER 20 MG C: 20 | 90 days supply | Qty: 180 | Fill #0

## 2016-02-27 NOTE — Patient Instructions (Signed)
STI testing today  We will call you within a week about your referral to allergist/asthma docs. If you do not hear within 2 weeks, give us a call.   Lets see each other back on as needed basis especially if worsening palpitations and you change your mind on heart monitoring

## 2016-02-27 NOTE — Progress Notes (Signed)
Tana Conch, MD  Subjective:  Katherine Wise is a 25 y.o. year old very pleasant female patient who presents for/with See problem oriented charting ROS- palpitations often at rest, shortness of breath and chest tightness with running, congestion and throat clearing with running  Past Medical History-  Patient Active Problem List   Diagnosis Date Noted  . Asthma 01/16/2016    Priority: Medium  . ADHD (attention deficit hyperactivity disorder) 03/18/2015    Priority: Medium  . Dysmenorrhea 03/18/2015    Priority: Medium  . Generalized anxiety disorder 01/18/2014    Priority: Medium  . Allergic rhinitis 01/16/2016    Priority: Low    Medications- reviewed and updated Current Outpatient Prescriptions  Medication Sig Dispense Refill  . amphetamine-dextroamphetamine (ADDERALL XR) 20 MG 24 hr capsule Take 20 mg by mouth daily.    . beclomethasone (QVAR) 80 MCG/ACT inhaler Inhale 2 puffs into the lungs 2 (two) times daily. 1 Inhaler 12  . norethindrone-ethinyl estradiol (JUNEL FE,GILDESS FE,LOESTRIN FE) 1-20 MG-MCG tablet Take 1 tablet by mouth daily.    . propranolol (INDERAL) 20 MG tablet Take 20 mg by mouth 3 (three) times daily.    Marland Kitchen albuterol (PROVENTIL HFA;VENTOLIN HFA) 108 (90 Base) MCG/ACT inhaler Inhale 2 puffs into the lungs every 6 (six) hours as needed for wheezing or shortness of breath. (Patient not taking: Reported on 02/27/2016) 1 Inhaler 5  . ALPRAZolam (XANAX) 0.5 MG tablet Take 0.5 mg by mouth 2 (two) times daily as needed for anxiety. Reported on 02/27/2016     No current facility-administered medications for this visit.    Objective: BP 110/70 mmHg  Pulse 73  Temp(Src) 98.4 F (36.9 C)  Wt 154 lb (69.854 kg) Gen: NAD, resting comfortably in chair CV: RRR no murmurs rubs or gallops Lungs: CTAB no crackles, wheeze, rhonchi Abdomen: soft/nontender/nondistended/normal bowel sounds. No rebound or guarding.  Ext: no edema, large ankles Skin: warm, dry, no  rash Neuro: grossly normal, moves all extremities  Assessment/Plan:  Asthma S: last visit we started qvar 80 2 puffs BID. She had been using albuterol before running and despite this would get some wheeze and some chest tightness. Even with qvar this has persisted. Constantly clearing throat when running. She is not using the propranolol from psychiatry regularly and issues happen even if she is not using this.   Also reports- Palpitations when laying down more, sometimes if standing up. On days not taking adderrall did not improve. Mild shortness of breath with it. Has not happened in a few days.   A/P: We will refer to allergy/asthma at this point. As previously noted, I think would be ideal to come off adderrall to see how her anxiety and palpitations and exercise symptoms are affected- she is firm that she needs medication for support for focus though. Perhaps nonstimulant through psych in future? Does not tolerate SSRI. Also takes xanax. I think a lot of her issues may be anxiety related - she declined workup for palpitations including cardiac monitoring. If persists- at least get cbc, tsh.    2 partners in last year. Unprotected sex. Requests STI testing- done today   PRN for palpitations. Return precautions advised.   Orders Placed This Encounter  Procedures  . RPR    [facility]  . HIV antibody    [facility]  . Ambulatory referral to Allergy    Referral Priority:  Routine    Referral Type:  Allergy Testing    Referral Reason:  Specialty Services Required  Requested Specialty:  Allergy    Number of Visits Requested:  1   The duration of face-to-face time during this visit was 25 minutes. Greater than 50% of this time was spent in counseling, explanation of diagnosis, planning of further management, and/or coordination of care.

## 2016-02-27 NOTE — Assessment & Plan Note (Signed)
S: last visit we started qvar 80 2 puffs BID. She had been using albuterol before running and despite this would get some wheeze and some chest tightness. Even with qvar this has persisted. Constantly clearing throat when running. She is not using the propranolol from psychiatry regularly and issues happen even if she is not using this.   Also reports- Palpitations when laying down more, sometimes if standing up. On days not taking adderrall did not improve. Mild shortness of breath with it. Has not happened in a few days.   A/P: We will refer to allergy/asthma at this point. As previously noted, I think would be ideal to come off adderrall to see how her anxiety and palpitations and exercise symptoms are affected- she is firm that she needs medication for support for focus though. Perhaps nonstimulant through psych in future? Does not tolerate SSRI. Also takes xanax. I think a lot of her issues may be anxiety related - she declined workup for palpitations including cardiac monitoring. If persists- at least get cbc, tsh.

## 2016-02-28 LAB — RPR

## 2016-02-28 LAB — HIV ANTIBODY (ROUTINE TESTING W REFLEX): HIV 1&2 Ab, 4th Generation: NONREACTIVE

## 2016-03-01 LAB — URINE CYTOLOGY ANCILLARY ONLY
CHLAMYDIA, DNA PROBE: NEGATIVE
Neisseria Gonorrhea: NEGATIVE
TRICH (WINDOWPATH): NEGATIVE

## 2016-03-17 MED FILL — NORETHIND-ETH ESTRAD 1-0.02: 1-20 | 63 days supply | Qty: 63 | Fill #3

## 2016-03-18 ENCOUNTER — Other Ambulatory Visit: Payer: Self-pay | Admitting: Family Medicine

## 2016-03-18 ENCOUNTER — Telehealth: Payer: Self-pay | Admitting: Family Medicine

## 2016-03-18 DIAGNOSIS — J3089 Other allergic rhinitis: Secondary | ICD-10-CM

## 2016-03-18 DIAGNOSIS — J4530 Mild persistent asthma, uncomplicated: Secondary | ICD-10-CM

## 2016-03-18 NOTE — Telephone Encounter (Signed)
Pt was following up on referral to allergy md. The first referral notes state Dr. Jetty Duhamellinton Young is not taking any new allergy patients.  We suggest contacting Dr. Kathyrn LassKozlow's office at [phone]. Cancelling referral. (4/12)  [facility]: Jessica PriestKozlow Eric J MD Address: [address], [place], [place] [address]  Phone: [phone]   Will you put in a new referral for pt to this dr's office?

## 2016-03-18 NOTE — Telephone Encounter (Signed)
Referral placed.

## 2016-04-09 ENCOUNTER — Ambulatory Visit (INDEPENDENT_AMBULATORY_CARE_PROVIDER_SITE_OTHER): Payer: 59 | Admitting: Family Medicine

## 2016-04-09 DIAGNOSIS — Z23 Encounter for immunization: Secondary | ICD-10-CM

## 2016-04-12 LAB — TB SKIN TEST
INDURATION: 0 mm
TB SKIN TEST: NEGATIVE

## 2016-04-13 ENCOUNTER — Ambulatory Visit: Payer: Self-pay | Admitting: Allergy and Immunology

## 2016-04-22 ENCOUNTER — Ambulatory Visit (INDEPENDENT_AMBULATORY_CARE_PROVIDER_SITE_OTHER): Payer: 59 | Admitting: Allergy and Immunology

## 2016-04-22 ENCOUNTER — Encounter: Payer: Self-pay | Admitting: Allergy and Immunology

## 2016-04-22 VITALS — BP 120/80 | HR 101 | Temp 98.6°F | Resp 12 | Ht 67.5 in | Wt 157.0 lb

## 2016-04-22 DIAGNOSIS — R05 Cough: Secondary | ICD-10-CM | POA: Diagnosis not present

## 2016-04-22 DIAGNOSIS — H1010 Acute atopic conjunctivitis, unspecified eye: Secondary | ICD-10-CM | POA: Diagnosis not present

## 2016-04-22 DIAGNOSIS — R062 Wheezing: Secondary | ICD-10-CM

## 2016-04-22 DIAGNOSIS — J309 Allergic rhinitis, unspecified: Secondary | ICD-10-CM | POA: Diagnosis not present

## 2016-04-22 DIAGNOSIS — R059 Cough, unspecified: Secondary | ICD-10-CM

## 2016-04-22 NOTE — Patient Instructions (Signed)
Take Home Sheet  1. Avoidance: Mite and Mold   2. Antihistamine: Allegra 180mg  by mouth once daily for runny nose or itching.   3. Nasal Spray: Rhinocort AQ 1-2 spray(s) each nostril once daily for stuffy nose or drainage.    4. Inhalers:  Rescue: ProAir 2 puffs every 4 hours as needed for cough or wheeze.       -May use 2 puffs 10-20 minutes prior to exercise.   Preventative: Dulera 100mcg  2 puffs twice daily (Rinse, gargle, and spit out after use).   5. Eye Drops: Elestat one drop(s) each eye twice daily for itchy eyes.   6. Other: Information on allergy injections.   7. Nasal Saline wash each evening at shower time.  8. Follow up Visit: 4 weeks or sooner if needed.   Websites that have reliable Patient information: 1. American Academy of Asthma, Allergy, & Immunology: www.aaaai.org 2. Food Allergy Network: www.foodallergy.org 3. Mothers of Asthmatics: www.aanma.org 4. [facility]: https://www.strong.com/www.njc.org 5. American College of Allergy, Asthma, & Immunology: [employer]ger[employer].iswww.allergy.mcg.edu or www.acaai.org  Control of House Dust Mite Allergen  House dust mites play a major role in allergic asthma and rhinitis.  They occur in environments with high humidity wherever human skin, the food for dust mites is found. High levels have been detected in dust obtained from mattresses, pillows, carpets, upholstered furniture, bed covers, clothes and soft toys.  The principal allergen of the house dust mite is found in its feces.  A gram of dust may contain 1,000 mites and 250,000 fecal particles.  Mite antigen is easily measured in the air during house cleaning activities.  1. Encase mattresses, including the box spring, and pillow, in an air tight cover.  Seal the zipper end of the encased mattresses with wide adhesive tape. 2. Wash the bedding in water of 130 degrees Farenheit weekly.  Avoid cotton comforters/quilts and flannel bedding: the most ideal bed covering is the  dacron comforter. 3. Remove all upholstered furniture from the bedroom. 4. Remove carpets, carpet padding, rugs, and non-washable window drapes from the bedroom.  Wash drapes weekly or use plastic window coverings. 5. Remove all non-washable stuffed toys from the bedroom.  Wash stuffed toys weekly. 6. Have the room cleaned frequently with a vacuum cleaner and a damp dust-mop.  The patient should not be in a room which is being cleaned and should wait 1 hour after cleaning before going into the room. 7. Close and seal all heating outlets in the bedroom.  Otherwise, the room will become filled with dust-laden air.  An electric heater can be used to heat the room. 8. Reduce indoor humidity to less than 50%.  Do not use a humidifier.  Reducing Pollen Exposure  The American Academy of Allergy, Asthma and Immunology suggests the following steps to reduce your exposure to pollen during allergy seasons.  9. Do not hang sheets or clothing out to dry; pollen may collect on these items. 10. Do not mow lawns or spend time around freshly cut grass; mowing stirs up pollen. 11. Keep windows closed at night.  Keep car windows closed while driving. 12. Minimize morning activities outdoors, a time when pollen counts are usually at their highest. 13. Stay indoors as much as possible when pollen counts or humidity is high and on windy days when pollen tends to remain in the air longer. 14. Use air conditioning when possible.  Many air conditioners have filters that trap the pollen spores. 15. Use a  HEPA room air filter to remove pollen form the indoor air you breathe.  Control of Mold Allergen  Mold and fungi can grow on a variety of surfaces provided certain temperature and moisture conditions exist.  Outdoor molds grow on plants, decaying vegetation and soil.  The major outdoor mold, Alternaria dn Cladosporium, are found in very high numbers during hot and dry conditions.  Generally, a late Summer - Fall peak is  seen for common outdoor fungal spores.  Rain will temporarily lower outdoor mold spore count, but counts rise rapidly when the rainy period ends.  The most important indoor molds are Aspergillus and Penicillium.  Dark, humid and poorly ventilated basements are ideal sites for mold growth.  The next most common sites of mold growth are the bathroom and the kitchen.  Outdoor [employer] 1. Use air conditioning and keep windows closed 2. Avoid exposure to decaying vegetation. 3. Avoid leaf raking. 4. Avoid grain handling. 5. Consider wearing a face mask if working in moldy areas.  Indoor Mold Control 1. Maintain humidity below 50%. 2. Clean washable surfaces with 5% bleach solution. 3. Remove sources e.g. Contaminated carpets.  Control of Cockroach Allergen  Cockroach allergen has been identified as an important cause of acute attacks of asthma, especially in urban settings.  There are fifty-five species of cockroach that exist in the [place], however only three, the [place], [place] species produce allergen that can affect patients with Asthma.  Allergens can be obtained from fecal particles, egg casings and secretions from cockroaches.  1. Remove food sources. 2. Reduce access to water. 3. Seal access and entry points. 4. Spray runways with 0.5-1% Diazinon or Chlorpyrifos 5. Blow boric acid power under stoves and refrigerator. 6. Place bait stations (hydramethylnon) at feeding sites.

## 2016-04-22 NOTE — Progress Notes (Signed)
NEW PATIENT NOTE  RE: Katherine Wise MRN: [id number] DOB: Dec 15, 1990 [facility] Katherine Wise [address] [place] [address] Date of Office Visit: 04/22/2016  Dear Katherine Majestic, MD:  I had the pleasure of seeing Katherine Wise today in initial evaluation as you recall-- Subjective:  Katherine Wise is a 25 y.o. female who presents today for New Patient (Initial Visit)  Assessment:   1. Allergic rhinoconjunctivitis, seasonal and perennial hypersensitivities.    2. History of cough and wheeze appears consistent with persistent asthma, incomplete control.  3. Intermittent GEReflux.  4.      History of mild atopic dermatitis. Plan:  No orders of the defined types were placed in this encounter.  1. Avoidance: Mite and Mold 2. Antihistamine: Allegra  by mouth once daily for runny nose or itching. 3. Nasal Spray: Rhinocort AQ 1-2 spray(s) each nostril once daily for stuffy nose or drainage.  4. Inhalers:  Rescue: ProAir 2 puffs every 4 hours as needed for cough or wheeze.       -May use 2 puffs 10-20 minutes prior to exercise.  Preventative: Dulera  2 puffs twice daily (Rinse, gargle, and spit out after use). 5. Eye Drops: Elestat one drop(s) each eye twice daily for itchy eyes. 6. Other: Information on allergy injections. 7. Nasal Saline wash each evening at shower time. 8. Follow up Visit: 4 weeks or sooner if needed.  HPI: Katherine Wise presents to the office in initial evaluation of recurring upper or lower respiratory symptoms.  She recalls a diagnosis of asthma about age 77  Years with intermittent episodes of cough, wheeze, shortness of breath, difficulty breathing, chest tightness and chest congestion.  She reports an extended period of time where she seemed symptom-free but has returned to her parents home in the last 9 months and feels that cat/dog trigger increasing symptoms.  She has year-round rhinorrhea, congestion, itchy watery eyes and postnasal  drip which is often provoked by pollen, outdoor, fluctuant weather pattern exposures and the animal dander.  She has had exercise induced symptoms, using albuterol about twice a week  and intermittently antihistamines and previously Qvar which was beneficial.  She describes patchy eczema areas at her extremities face and chest without swelling, hives, or specific food sensitivity.  However, tends to minimize dairy due to GI upset (diarrhea, constipation and bloating) and nausea/reflux triggers include onion, chocolate and soy.  There has been intermittent headaches without sore throat or fever and previous diagnosis of bronchitis.  Denies ED or Urgent care visits, prednisone or antibiotic courses.  Medical History: Past Medical History  Diagnosis Date  . Generalized anxiety disorder 01/18/2014    Dr. Ceasar Mons psychiatry. Lawerance Bach for counseling.     . ADHD (attention deficit hyperactivity disorder) 03/18/2015    Dr. Ceasar Mons psychiatry. ? OCD taking Adderall XR . May be changing due to anxiety   . Dysmenorrhea 03/18/2015    Birth control- Junel improved this.     . Exercise-induced asthma    Surgical History: Past Surgical History  Procedure Laterality Date  . Adenoidectomy    . Ankle sugery    . Tympanostomy      1 set   Family History: Family History  Problem Relation Age of Onset  . Cancer Maternal Aunt     breast- late 42s  . Cancer Maternal Grandmother     breast-late 40s  . ADD / ADHD Mother   . Allergic rhinitis Mother   . Asthma Mother   .  Hypertension Father    Social History: Social History  . Marital Status: Single    Spouse Name: N/A  . Number of Children: N/A  . Years of Education: N/A   Social History Main Topics  . Smoking status: Never Smoker   . Smokeless tobacco: Not on file  . Alcohol Use: 0.6 oz/week    1 Standard drinks or equivalent per week  . Drug Use: No  . Sexual Activity:      Yes                                      Social History Narrative     Single. Dating.       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    2 years at [employer], 1 year at [employer] basic EMT.    Aysa has a current medication list which includes the following prescription(s): albuterol, alprazolam, amphetamine-dextroamphetamine, norethindrone-ethinyl estradiol.   Drug Allergies: No Known Allergies  Environmental History: Katherine Wise lives in a 23 year old house with carpet floors, with central heat and air; stuffed mattress, non-feather pillow/comforter with bedroom humidifier, was dog and 3 cats without smokers.   Review of Systems  Constitutional: Negative for fever, weight loss and malaise/fatigue.       Negative PPD.  Childhood varicella disease.    HENT: Positive for congestion. Negative for ear pain, hearing loss, nosebleeds and sore throat.   Eyes: Negative for discharge and redness.  Respiratory: Negative for shortness of breath.        Previous history of bronchitis. Denies history of pneumonia.  Gastrointestinal: Positive for heartburn. Negative for nausea, vomiting, abdominal pain, diarrhea and constipation.  Genitourinary: Negative.   Musculoskeletal: Negative for myalgias and joint pain.  Skin: Positive for itching and rash.  Neurological: Positive for headaches. Negative for dizziness, seizures and weakness.  Endo/Heme/Allergies: Positive for environmental allergies.       Denies sensitivity to aspirin, NSAIDs, stinging insects,  latex and jewelry.  Occasional itching with certain cosmetics.  Immunological: No chronic or recurring infections. Objective:   Filed Vitals:   04/22/16 1345  BP: 120/80  Pulse: 101  Temp: 98.6 F (37 C)  Resp: 12   SpO2 Readings from Last 1 Encounters:  04/22/16 97%   Physical Exam  Constitutional: She is well-developed, well-nourished, and in no distress.  HENT:  Head: Atraumatic.  Right Ear: Tympanic membrane and ear canal normal.  Left Ear: Tympanic  membrane and ear canal normal.  Nose: Mucosal edema present. No rhinorrhea. No epistaxis.  Mouth/Throat: Oropharynx is clear and moist and mucous membranes are normal. No oropharyngeal exudate, posterior oropharyngeal edema or posterior oropharyngeal erythema.  Eyes: Conjunctivae are normal.  Neck: Neck supple.  Cardiovascular: Normal rate, S1 normal and S2 normal.   No murmur heard. Pulmonary/Chest: Effort normal. She has no wheezes. She has no rhonchi. She has no rales.  Post-Xopenex/Atrovent: Continues to be clear to auscultation without wheeze, rhonchi or crackles.  Abdominal: Soft. Normal appearance and bowel sounds are normal.  Musculoskeletal: She exhibits no edema.  Lymphadenopathy:    She has no cervical adenopathy.  Neurological: She is alert.  Skin: Skin is warm and intact. No rash noted. No cyanosis. Nails show no clubbing.   Diagnostics: Spirometry:  FVC 4.28--105%, FEV1 3.53--102%, FEF 25-75 % 3.36--85% sign-; postbronchodilator improvement 4.82--118%, FEV1 4.09--118%,  FEF 25-75 % 4.65 -- 118%.  Skin testing:  Mild reactivity to multiple, mugwort weed pollen, selected mold species, dust mite mix and grass mix pollen.  ACT=18.    Roselyn M. Willa RoughHicks, MD   cc: Tana ConchStephen Hunter, MD

## 2016-04-26 ENCOUNTER — Telehealth: Payer: Self-pay

## 2016-04-26 NOTE — Telephone Encounter (Signed)
Spoke with patient advised to apply cool compresses and hydrocortisone cream or benadryl cream to sites to help with itching and swelling. Advised not to scratch since she can break the skin and it could get infected patient verbalized understanding will call back if any further problems. Patient wanted to know if she needed to come in advised not at this time it could be delayed reaction to some of the molds. Patient verbalizes understanding

## 2016-04-26 NOTE — Telephone Encounter (Signed)
Katherine Wise Came and seen Dr. Willa RoughHicks on 04/22/16 she woke up Friday and her reaction sites were itchy red and broken out. She has been taking all the meds prescribed but it hasn't gone away.   Please Advise  Thanks

## 2016-04-30 MED FILL — guanFACINE HCL ER 2 MG TB24: 2 | 90 days supply | Qty: 90 | Fill #0

## 2016-04-30 MED FILL — SERTRALINE HCL 25 MG TABLET: 25 | 90 days supply | Qty: 90 | Fill #0

## 2016-04-30 MED FILL — DEXTROAMP-AMPHETAMIN 20 MG: 20 | 90 days supply | Qty: 90 | Fill #0

## 2016-05-24 MED FILL — NORETHIND-ETH ESTRAD 1-0.02: 1-20 | 28 days supply | Qty: 21 | Fill #0

## 2016-05-26 DIAGNOSIS — Z0100 Encounter for examination of eyes and vision without abnormal findings: Secondary | ICD-10-CM | POA: Diagnosis not present

## 2016-05-27 ENCOUNTER — Ambulatory Visit (INDEPENDENT_AMBULATORY_CARE_PROVIDER_SITE_OTHER): Payer: 59 | Admitting: Allergy and Immunology

## 2016-05-27 ENCOUNTER — Encounter: Payer: Self-pay | Admitting: Allergy and Immunology

## 2016-05-27 VITALS — BP 112/70 | HR 92 | Resp 18

## 2016-05-27 DIAGNOSIS — R062 Wheezing: Secondary | ICD-10-CM | POA: Diagnosis not present

## 2016-05-27 DIAGNOSIS — J309 Allergic rhinitis, unspecified: Secondary | ICD-10-CM | POA: Diagnosis not present

## 2016-05-27 DIAGNOSIS — R05 Cough: Secondary | ICD-10-CM

## 2016-05-27 DIAGNOSIS — H1010 Acute atopic conjunctivitis, unspecified eye: Secondary | ICD-10-CM

## 2016-05-27 DIAGNOSIS — R059 Cough, unspecified: Secondary | ICD-10-CM

## 2016-05-27 MED ORDER — MOMETASONE FURO-FORMOTEROL FUM 100-5 MCG/ACT IN AERO: INHALATION_SPRAY | RESPIRATORY_TRACT | Status: DC

## 2016-05-27 MED ORDER — ALBUTEROL SULFATE HFA 108 (90 BASE) MCG/ACT IN AERS: INHALATION_SPRAY | RESPIRATORY_TRACT | Status: DC

## 2016-05-27 MED FILL — DULERA 100 MCG/5 MCG INH: 100-5 | 30 days supply | Qty: 13 | Fill #0

## 2016-05-27 MED FILL — VENTOLIN HFA 90 MCG INHALER: 108 (90 BAS | 16 days supply | Qty: 18 | Fill #0

## 2016-05-27 MED FILL — guanFACINE HCL ER 1 MG TB24: 1 | 90 days supply | Qty: 90 | Fill #0

## 2016-05-27 NOTE — Progress Notes (Addendum)
     FOLLOW UP NOTE  RE: Katherine Wise MRN: [id number] DOB: [birth year] [facility] [place] [address] [place] [id number] Date of Office Visit: 05/27/2016  Subjective:  Katherine Wise is a 25 y.o. female who presents today for Asthma  Assessment:   1. History of cough and wheeze consistent with persistent asthma, improved with dual controller therapy.   2. History of mild atopic dermatitis, well controlled.  3. Allergic rhinoconjunctivitis    Plan:   Meds ordered this encounter  Medications  . albuterol (PROVENTIL HFA;VENTOLIN HFA) 108 (90 Base) MCG/ACT inhaler    Sig: Use 2 puffs every four hours as needed for cough or wheeze.  May use 2 puffs 10-20 minutes prior to exercise.    Dispense:  1 Inhaler    Refill:  1  . mometasone-formoterol (DULERA) 100-5 MCG/ACT AERO    Sig: Use 2 puffs twice daily to prevent cough or wheeze.  Rinse, gargle, and spit after use.    Dispense:  1 Inhaler    Refill:  3  1.  Continue with Dulera 100mcg 2 puffs twice daily, rinse, gargle and spit with water after use. 2.  Restart Allegra 180mg  once daily. --samples and coupon given today. 3.  Rhinocort AQ 1-2 sprays once daily. 4.  Add sudafed 30mg  1-2 tabs once daily as needed instead of the high dose Allegra D. 5.  Albuterol HFA 2 puffs as needed every 4 hours--call with any recurring use. 6.  Follow-up in 6 months or sooner if needed. 7.  Let office know if schedule would seem to accommodate allergy injections--given work and school.  HPI: Katherine Wise returns to the office in follow-up of cough/wheeze and allergic rhinoconjunctivitis.  She reports feeling significantly better since change of maintenance medications.  Consistently describes 80% good with no disrupted sleep or activity.  Her only difficulty appears to be exposure of cig smoke from brother but would like to [facility] instead of going back to QVAR.  She found the Allegra and Rhinocort very beneficial as  well.  She reports no new concerns today and feels exercise capacity is good. Denies ED or urgent care visits, prednisone or antibiotic courses. She feels her skin is doing well without recent itching.  Katherine Wise a current medication list which includes the following prescription(s): albuterol, alprazolam, amphetamine-dextroamphetamine, norethindrone-ethinyl estradiol.   Drug Allergies: No Known Allergies  Objective:   Filed Vitals:   05/27/16 1041  BP: 112/70  Pulse: 92  Resp: 18   SpO2 Readings from Last 1 Encounters:  05/27/16 99%   Physical Exam  Constitutional: She is well-developed, well-nourished, and in no distress.  HENT:  Head: Atraumatic.  Right Ear: Tympanic membrane and ear canal normal.  Left Ear: Tympanic membrane and ear canal normal.  Nose: Mucosal edema present. No rhinorrhea. No epistaxis.  Mouth/Throat: Oropharynx is clear and moist and mucous membranes are normal. No oropharyngeal exudate, posterior oropharyngeal edema or posterior oropharyngeal erythema.  Neck: Neck supple.  Cardiovascular: Normal rate, S1 normal and S2 normal.   No murmur heard. Pulmonary/Chest: Effort normal. She Wise no wheezes. She Wise no rhonchi. She Wise no rales.  Lymphadenopathy:    She Wise no cervical adenopathy.   Diagnostics: Spirometry:  FVC  4.62--113%, FEV1  4.12--119%.  ACT=20.    Juquan Reznick M. Willa RoughHicks, MD  cc: Tana ConchStephen Hunter, MD

## 2016-05-27 NOTE — Patient Instructions (Addendum)
   Continue with Dulera 100mcg 2 puffs twice daily, rinse, gargle and spit with water after use.  Restart Allegra 180mg  once daily. --samples and coupon given today.  Rhinocort AQ 1-2 sprays once daily.  Add sudafed 30mg  1-2 tabs once daily as needed instead of the high dose Allegra D.  Albuterol HFA 2 puffs as needed every 4 hours--call with any recurring use.  Follow-up in 6 months or sooner if needed.  Let office know if schedule would seem to accommodate allergy injections--given work and school.

## 2016-05-30 ENCOUNTER — Encounter: Payer: Self-pay | Admitting: Allergy and Immunology

## 2016-07-05 MED FILL — NORETHIND-ETH ESTRAD 1-0.02: 1-20 | 28 days supply | Qty: 21 | Fill #0

## 2016-07-11 ENCOUNTER — Ambulatory Visit (HOSPITAL_COMMUNITY)
Admission: EM | Admit: 2016-07-11 | Discharge: 2016-07-11 | Disposition: A | Discharge status: Home / Self Care | Payer: 59 | Attending: Family Medicine | Admitting: Family Medicine

## 2016-07-11 ENCOUNTER — Encounter (HOSPITAL_COMMUNITY): Payer: Self-pay | Admitting: *Deleted

## 2016-07-11 DIAGNOSIS — Z79899 Other long term (current) drug therapy: Secondary | ICD-10-CM | POA: Insufficient documentation

## 2016-07-11 DIAGNOSIS — F411 Generalized anxiety disorder: Secondary | ICD-10-CM | POA: Insufficient documentation

## 2016-07-11 DIAGNOSIS — Z87440 Personal history of urinary (tract) infections: Secondary | ICD-10-CM | POA: Diagnosis not present

## 2016-07-11 DIAGNOSIS — F909 Attention-deficit hyperactivity disorder, unspecified type: Secondary | ICD-10-CM | POA: Insufficient documentation

## 2016-07-11 DIAGNOSIS — N390 Urinary tract infection, site not specified: Secondary | ICD-10-CM | POA: Diagnosis not present

## 2016-07-11 LAB — POCT URINALYSIS DIP (DEVICE)
Bilirubin Urine: NEGATIVE
GLUCOSE, UA: NEGATIVE mg/dL
Hgb urine dipstick: NEGATIVE
Ketones, ur: NEGATIVE mg/dL
Nitrite: NEGATIVE
PROTEIN: NEGATIVE mg/dL
Specific Gravity, Urine: 1.01 (ref 1.005–1.030)
UROBILINOGEN UA: 0.2 mg/dL (ref 0.0–1.0)
pH: 6.5 (ref 5.0–8.0)

## 2016-07-11 LAB — POCT PREGNANCY, URINE: Preg Test, Ur: NEGATIVE

## 2016-07-11 MED ORDER — FLUCONAZOLE 150 MG PO TABS: 150.0000 mg | ORAL_TABLET | Freq: Once | ORAL | 0 refills | Status: AC

## 2016-07-11 MED ORDER — SULFAMETHOXAZOLE-TRIMETHOPRIM 800-160 MG PO TABS: 1.0000 | ORAL_TABLET | Freq: Two times a day (BID) | ORAL | 0 refills | Status: DC

## 2016-07-11 NOTE — ED Provider Notes (Addendum)
[facility]    CSN: [id number] Arrival date & time: 07/11/16  1351  First Provider Contact:  None       History   Chief Complaint Chief Complaint  Patient presents with  . Urinary Tract Infection    HPI Katherine Wise is a 25 y.o. female.   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 In the past she's had urinary infections that were difficult to treat, but she has not had an infection in over a year. Her symptoms began today.  No fever or flank pain or hematuria      Past Medical History:  Diagnosis Date  . ADHD (attention deficit hyperactivity disorder) 03/18/2015   Dr. Ceasar Monsard psychiatry. ? OCD taking Adderall XR 20mg . May be changing due to anxiety   . Dysmenorrhea 03/18/2015   Birth control- Junel improved this.  As of 03/18/15 in homosexual relationship and not active with males-trialing off. Will see if has regular periods. Concerned about libido.    Marland Kitchen. Exercise-induced asthma   . Generalized anxiety disorder 01/18/2014   Dr. Ceasar Monsard psychiatry. Lawerance BachBarbara Foust for counseling. Xanax 0.5mg  BID prn.  Trazodone for sleep SSRI tried (lexapro) and felt "numb" and got speeding ticket and didn't care     Patient Active Problem List   Diagnosis Date Noted  . Asthma 01/16/2016  . Allergic rhinitis 01/16/2016  . ADHD (attention deficit hyperactivity disorder) 03/18/2015  . Dysmenorrhea 03/18/2015  . Generalized anxiety disorder 01/18/2014    Past Surgical History:  Procedure Laterality Date  . ADENOIDECTOMY    . ankle sugery    . TYMPANOSTOMY     1 set    OB History    No data available       Home Medications    Prior to Admission medications   Medication Sig Start Date End Date Taking? Authorizing Provider  amphetamine-dextroamphetamine (ADDERALL XR) 20 MG 24 hr capsule Take 20 mg by mouth daily.   Yes Historical Provider, MD  amphetamine-dextroamphetamine (ADDERALL) 20 MG tablet  04/30/16  Yes Historical  Provider, MD  norethindrone-ethinyl estradiol (JUNEL FE,GILDESS FE,LOESTRIN FE) 1-20 MG-MCG tablet Take 1 tablet by mouth daily.   Yes Historical Provider, MD  albuterol (PROVENTIL HFA;VENTOLIN HFA) 108 (90 Base) MCG/ACT inhaler Use 2 puffs every four hours as needed for cough or wheeze.  May use 2 puffs 10-20 minutes prior to exercise. 05/27/16   Roselyn Kara MeadM Hicks, MD  ALPRAZolam Prudy Feeler(XANAX) 0.5 MG tablet Take 0.5 mg by mouth 2 (two) times daily as needed for anxiety. Reported on 02/27/2016    Historical Provider, MD  beclomethasone (QVAR) 80 MCG/ACT inhaler Inhale 2 puffs into the lungs 2 (two) times daily. Patient not taking: Reported on 04/22/2016 01/16/16   Shelva MajesticStephen O Hunter, MD  fluconazole (DIFLUCAN) 150 MG tablet Take 1 tablet (150 mg total) by mouth once. Repeat if needed 07/11/16 07/11/16  Elvina SidleKurt Daven Pinckney, MD  mometasone-formoterol [facility](DULERA) 100-5 MCG/ACT AERO Use 2 puffs twice daily to prevent cough or wheeze.  Rinse, gargle, and spit after use. 05/27/16   Roselyn Kara MeadM Hicks, MD  propranolol (INDERAL) 20 MG tablet Take 20 mg by mouth 3 (three) times daily. Reported on 05/27/2016    Historical Provider, MD  sulfamethoxazole-trimethoprim (BACTRIM DS,SEPTRA DS) 800-160 MG tablet Take 1 tablet by mouth 2 (two) times daily. 07/11/16   Elvina SidleKurt Maia Handa, MD    Family History Family History  Problem Relation Age of Onset  . ADD / ADHD Mother   .  Allergic rhinitis Mother   . Asthma Mother   . Hypertension Father   . Cancer Maternal Aunt     breast- late 22s  . Cancer Maternal Grandmother     breast-late 13s    Social History Social History  Substance Use Topics  . Smoking status: Never Smoker  . Smokeless tobacco: Not on file  . Alcohol use Yes     Comment: occasional     Allergies   Review of patient's allergies indicates no known allergies.   Review of Systems Review of Systems   Physical Exam Triage Vital Signs ED Triage Vitals  Enc Vitals Group     BP 07/11/16 1427 124/83     Pulse Rate  07/11/16 1427 105     Resp 07/11/16 1427 16     Temp 07/11/16 1427 98.2 F (36.8 C)     Temp Source 07/11/16 1427 Oral     SpO2 07/11/16 1427 99 %     Weight --      Height --      Head Circumference --      Peak Flow --      Pain Score 07/11/16 1429 2     Pain Loc --      Pain Edu? --      Excl. in GC? --    No data found.   Updated Vital Signs BP 124/83 (BP Location: Right Arm)   Pulse 105   Temp 98.2 F (36.8 C) (Oral)   Resp 16   LMP 07/01/2016 (Exact Date)   SpO2 99%       Physical Exam  Constitutional: She is oriented to person, place, and time. She appears well-developed and well-nourished.  HENT:  Head: Normocephalic.  Eyes: Conjunctivae are normal.  Neck: Normal range of motion. Neck supple.  Abdominal: Soft. There is tenderness.  Suprapubic. No flank pain  Musculoskeletal: Normal range of motion.  Neurological: She is alert and oriented to person, place, and time.  Nursing note and vitals reviewed.    UC Treatments / Results  Labs (all labs ordered are listed, but only abnormal results are displayed) Labs Reviewed  POCT URINALYSIS DIP (DEVICE) - Abnormal; Notable for the following:       Result Value   Leukocytes, UA SMALL (*)    All other components within normal limits  URINE CULTURE  POCT PREGNANCY, URINE  URINE CYTOLOGY ANCILLARY ONLY    EKG  EKG Interpretation None       Radiology No results found.  Procedures Procedures (including critical care time)  Medications Ordered in UC Medications - No data to display   Initial Impression / Assessment and Plan / UC Course  I have reviewed the triage vital signs and the nursing notes.  Pertinent labs & imaging results that were available during my care of the patient were reviewed by me and considered in my medical decision making (see chart for details).  Clinical Course      Final Clinical Impressions(s) / UC Diagnoses   Final diagnoses:  UTI (lower urinary tract  infection)    New Prescriptions New Prescriptions   FLUCONAZOLE (DIFLUCAN) 150 MG TABLET    Take 1 tablet (150 mg total) by mouth once. Repeat if needed   SULFAMETHOXAZOLE-TRIMETHOPRIM (BACTRIM DS,SEPTRA DS) 800-160 MG TABLET    Take 1 tablet by mouth 2 (two) times daily.     Elvina Sidle, MD 07/11/16 1456    Elvina Sidle, MD 07/11/16 1457    Elvina Sidle,  MD 07/11/16 1502    Elvina SidleKurt Gaila Engebretsen, MD 07/11/16 2025

## 2016-07-11 NOTE — ED Triage Notes (Signed)
Hx frequent UTIs.  Pt states she would also like to be tested for STDs; denies any sxs.

## 2016-07-12 LAB — URINE CYTOLOGY ANCILLARY ONLY
Chlamydia: NEGATIVE
Neisseria Gonorrhea: NEGATIVE

## 2016-07-13 LAB — URINE CULTURE: Culture: >=100000 — AB

## 2016-07-15 ENCOUNTER — Other Ambulatory Visit: Payer: Self-pay | Admitting: Obstetrics and Gynecology

## 2016-07-15 DIAGNOSIS — Z01419 Encounter for gynecological examination (general) (routine) without abnormal findings: Secondary | ICD-10-CM | POA: Diagnosis not present

## 2016-07-15 DIAGNOSIS — Z6823 Body mass index (BMI) 23.0-23.9, adult: Secondary | ICD-10-CM | POA: Diagnosis not present

## 2016-07-15 DIAGNOSIS — Z124 Encounter for screening for malignant neoplasm of cervix: Secondary | ICD-10-CM | POA: Diagnosis not present

## 2016-07-15 MED FILL — NITROFURANTOIN MCR 50 MG CA: 50 | 50 days supply | Qty: 50 | Fill #0

## 2016-07-19 LAB — CYTOLOGY - PAP

## 2016-07-27 MED FILL — DULERA 100 MCG/5 MCG INH: 100-5 | 30 days supply | Qty: 13 | Fill #1

## 2016-07-27 MED FILL — NORETHIND-ETH ESTRAD 1-0.02: 1-20 | 28 days supply | Qty: 21 | Fill #1

## 2016-08-30 MED FILL — NORETHIND-ETH ESTRAD 1-0.02: 1-20 | 84 days supply | Qty: 63 | Fill #0

## 2016-09-02 MED FILL — guanFACINE HCL ER 1 MG TB24: 1 | 90 days supply | Qty: 90 | Fill #1

## 2016-09-17 MED FILL — DULERA 100 MCG/5 MCG INH: 100-5 | 30 days supply | Qty: 13 | Fill #2

## 2016-09-23 ENCOUNTER — Telehealth: Payer: 59 | Admitting: Physician Assistant

## 2016-09-23 DIAGNOSIS — R300 Dysuria: Secondary | ICD-10-CM

## 2016-09-23 MED ORDER — NITROFURANTOIN MONOHYD MACRO 100 MG PO CAPS: 100.0000 mg | ORAL_CAPSULE | Freq: Two times a day (BID) | ORAL | 0 refills | Status: DC

## 2016-09-23 MED FILL — NITROFURANTOIN MONO-MCR 100: 100 | 5 days supply | Qty: 10 | Fill #0

## 2016-09-23 NOTE — Progress Notes (Signed)

## 2016-10-22 MED FILL — DEXTROAMP-AMPHET ER 20 MG C: 20 | 30 days supply | Qty: 30 | Fill #0

## 2016-10-22 MED FILL — DEXTROAMP-AMPHETAMIN 20 MG: 20 | 30 days supply | Qty: 30 | Fill #0

## 2016-11-19 MED FILL — DULERA 100 MCG/5 MCG INH: 100-5 | 30 days supply | Qty: 13 | Fill #3

## 2016-11-19 MED FILL — LARIN 21 1-20 TABLET: 1-20 | 84 days supply | Qty: 63 | Fill #1

## 2016-11-19 MED FILL — DEXTROAMP-AMPHET ER 20 MG C: 20 | 90 days supply | Qty: 180 | Fill #0

## 2016-11-19 MED FILL — DEXTROAMP-AMPHETAMIN 20 MG: 20 | 90 days supply | Qty: 90 | Fill #0

## 2016-12-10 MED FILL — guanFACINE HCL ER 1 MG TB24: 1 | 90 days supply | Qty: 90 | Fill #2

## 2017-01-24 MED FILL — traZODone HCL 50 MG TABS: 50 | 90 days supply | Qty: 270 | Fill #1

## 2017-01-25 ENCOUNTER — Telehealth: Payer: Self-pay | Admitting: Allergy & Immunology

## 2017-01-25 NOTE — Telephone Encounter (Signed)
Called patient and informed her that we would not be able to fill Dulera until Thursdays visit (01/27/2017). Patient understood.

## 2017-01-25 NOTE — Telephone Encounter (Signed)
Pt called to make appointment for 01/27/2017 with dr Dellis AnesGallagher and [facility] [phone].

## 2017-01-27 ENCOUNTER — Encounter: Payer: Self-pay | Admitting: Allergy & Immunology

## 2017-01-27 ENCOUNTER — Ambulatory Visit (INDEPENDENT_AMBULATORY_CARE_PROVIDER_SITE_OTHER): Payer: 59 | Admitting: Allergy & Immunology

## 2017-01-27 VITALS — BP 112/70 | HR 100 | Temp 98.8°F | Resp 14 | Ht 67.0 in | Wt 146.0 lb

## 2017-01-27 DIAGNOSIS — J4540 Moderate persistent asthma, uncomplicated: Secondary | ICD-10-CM

## 2017-01-27 DIAGNOSIS — J3089 Other allergic rhinitis: Secondary | ICD-10-CM

## 2017-01-27 NOTE — Progress Notes (Signed)
FOLLOW UP  Date of Service/Encounter:  01/27/17   Assessment:   Moderate persistent asthma, uncomplicated  Perennial allergic rhinitis (weeds, grass, molds)   Asthma Reportables:  Severity: moderate persistent  Risk: low Control: well controlled  Seasonal Influenza Vaccine: yes    Plan/Recommendations:    1. Moderate persistent asthma, uncomplicated - Spirometry looks good today. - We will continue with the same regimen since this seems to be working. - Daily controller medication(s): Dulera 100/5 two puffs twice daily with spacer - Rescue medications: ProAir 4 puffs every 4-6 hours as needed - Asthma control goals:  * Full participation in all desired activities (may need albuterol before activity) * Albuterol use two time or less a week on average (not counting use with activity) * Cough interfering with sleep two time or less a month * Oral steroids no more than once a year * No hospitalizations  2. Perennial allergic rhinitis - Start nasal saline rinses 1-2 times daily.  - Stop the Flonase and start Dysmista two sprays per nostril daily (sample provided) - Continue with Allegra 180mg  once daily.  3. Return in about 6 months (around 07/30/2017).   Subjective:   Katherine Wise is a 26 y.o. female presenting today for follow up of  Chief Complaint  Patient presents with  . Asthma    doing pretty well. rescue inhaler at most 1 or 2 times a week and before exercise.     Katherine GlaserKelly G Wise has a history of the following: Patient Active Problem List   Diagnosis Date Noted  . Asthma 01/16/2016  . Allergic rhinitis 01/16/2016  . ADHD (attention deficit hyperactivity disorder) 03/18/2015  . Dysmenorrhea 03/18/2015  . Generalized anxiety disorder 01/18/2014    History obtained from: chart review and patient.  Katherine GlaserKelly G Helfrich was referred by Tana ConchStephen Hunter, MD.     Katherine Wise is a 26 y.o. female presenting for a follow up visit. She was last seen in July 2017 by Dr.  Willa RoughHicks, who has since left the practice. At that time, she was feeling better since [facility]. She was continued on Dulera 100/5 2 inhalations in the morning and 2 inhalations at night. She was also continued on her Allegra as well as Rhinocort. Her last testing was performed in June 2017 and was positive to weeds, grasses, mold mix 1, and mold mix 2.  Since last visit, she has done very well. Katherine Wise's asthma has been well controlled. She has not required rescue medication, experienced nocturnal awakenings due to lower respiratory symptoms, nor have activities of daily living been limited. She has needed no ER visits or urgent care visits for asthma. She has not needed any prednisone. Her allergic rhinitis symptoms are fairly well controlled, although she still has breakthrough nasal congestion with copious rhinorrhea. She has been compliant with the Flonase since last visit, which has not been working very well. She has also been on Rhinocort in the past as well. She vacillates between antihistamines, but is currently using Allegra daily. Her worst times of the year in spring, but she has symptoms throughout the entire year. She is currently living with her parents after moving away during her undergrad. Since living with her parents, her symptoms have worsened.  Otherwise, there have been no changes to her past medical history, surgical history, family history, or social history. 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. 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    Review of Systems: a 14-point review of systems is pertinent for what is mentioned in HPI.  Otherwise, all other systems were negative. Constitutional: negative other than that listed in the HPI Eyes: negative other than that listed in the HPI Ears, nose, mouth, throat, and face: negative other than that listed in the HPI Respiratory: negative other than  that listed in the HPI Cardiovascular: negative other than that listed in the HPI Gastrointestinal: negative other than that listed in the HPI Genitourinary: negative other than that listed in the HPI Integument: negative other than that listed in the HPI Hematologic: negative other than that listed in the HPI Musculoskeletal: negative other than that listed in the HPI Neurological: negative other than that listed in the HPI Allergy/Immunologic: negative other than that listed in the HPI    Objective:   Blood pressure 112/70, pulse 100, temperature 98.8 F (37.1 C), temperature source Oral, resp. rate 14, height 5\' 7"  (1.702 m), weight 146 lb (66.2 kg), SpO2 98 %. Body mass index is 22.87 kg/m.   Physical Exam:  General: Alert, interactive, in no acute distress. Pleasant.  Eyes: No conjunctival injection present on the right, No conjunctival injection present on the left, PERRL bilaterally, No discharge on the right, No discharge on the left, No Horner-Trantas dots present and allergic shiners present bilaterally Ears: Right TM pearly gray with normal light reflex, Left TM pearly gray with normal light reflex, Right TM intact without perforation and Left TM intact without perforation.  Nose/Throat: External nose within normal limits and septum midline, turbinates edematous and pale with clear discharge, post-pharynx erythematous with cobblestoning in the posterior oropharynx. Tonsils 2+ without exudates Neck: Supple without thyromegaly. Lungs: Clear to auscultation without wheezing, rhonchi or rales. No increased work of breathing. CV: Normal S1/S2, no murmurs. Capillary refill <2 seconds.  Skin: Warm and dry, without lesions or rashes. Neuro:   Grossly intact. No focal deficits appreciated. Responsive to questions.   Diagnostic studies:  Spirometry: results normal (FEV1: 4.04/121%, FVC: 4.57/116%, FEV1/FVC: 88%).    Spirometry consistent with normal pattern.   Allergy Studies:  none   Malachi Bonds, MD [facility]

## 2017-01-27 NOTE — Patient Instructions (Addendum)
1. Moderate persistent asthma, uncomplicated - Spirometry looks good today. - We will continue with the same regimen since this seems to be working. - Daily controller medication(s): Dulera 100/5 two puffs twice daily with spacer - Rescue medications: ProAir 4 puffs every 4-6 hours as needed - Asthma control goals:  * Full participation in all desired activities (may need albuterol before activity) * Albuterol use two time or less a week on average (not counting use with activity) * Cough interfering with sleep two time or less a month * Oral steroids no more than once a year * No hospitalizations  2. Perennial allergic rhinitis - Start nasal saline rinses 1-2 times daily.  - Stop the Flonase and start Dysmista two sprays per nostril daily (sample provided) - Continue with Allegra 180mg  once daily.  3. Return in about 6 months (around 07/30/2017).  Please inform us of any Emergency Department visits, hospitalizations, or changes in symptoms. Call us before going to the ED for breathing or allergy symptoms since we might be able to fit you in for a sick visit. Feel free to contact us anytime with any questions, problems, or concerns.  It was a pleasure to meet you today! Happy spring!   Websites that have reliable patient information: 1. American Academy of Asthma, Allergy, and Immunology: www.aaaai.org 2. Food Allergy Research and Education (FARE): foodallergy.org 3. Mothers of Asthmatics: http://www.asthmacommunitynetwork.org 4. American College of Allergy, Asthma, and Immunology: www.acaai.org

## 2017-01-28 ENCOUNTER — Other Ambulatory Visit: Payer: Self-pay

## 2017-01-28 MED ORDER — MOMETASONE FURO-FORMOTEROL FUM 100-5 MCG/ACT IN AERO: INHALATION_SPRAY | RESPIRATORY_TRACT | 3 refills | Status: DC

## 2017-01-28 MED FILL — DULERA 100 MCG/5 MCG INH: 100-5 | 30 days supply | Qty: 13 | Fill #0

## 2017-03-21 MED FILL — DULERA 100 MCG/5 MCG INH: 100-5 | 30 days supply | Qty: 13 | Fill #1

## 2017-03-21 MED FILL — guanFACINE HCL ER 1 MG TB24: 1 | 90 days supply | Qty: 90 | Fill #3

## 2017-06-22 MED FILL — guanFACINE HCL ER 1 MG TB24: 1 | 30 days supply | Qty: 30 | Fill #0

## 2017-07-08 ENCOUNTER — Other Ambulatory Visit: Payer: Self-pay

## 2017-07-08 ENCOUNTER — Other Ambulatory Visit: Payer: Self-pay | Admitting: Family Medicine

## 2017-07-08 MED ORDER — ALBUTEROL SULFATE HFA 108 (90 BASE) MCG/ACT IN AERS: INHALATION_SPRAY | RESPIRATORY_TRACT | 1 refills | Status: DC

## 2017-07-08 MED FILL — VENTOLIN HFA 90 MCG INHALER: 108 (90 BAS | 16 days supply | Qty: 18 | Fill #0

## 2017-07-08 MED FILL — DEXTROAMP-AMPHETAMIN 20 MG: 20 | 90 days supply | Qty: 90 | Fill #0

## 2017-07-08 MED FILL — ADDERALL XR 20 MG CAP SA: 20 | 90 days supply | Qty: 180 | Fill #0

## 2017-07-08 NOTE — Telephone Encounter (Signed)
We received a fax from [facility] in regards to a refill for Ventolin. I sent in refill.

## 2017-07-11 MED FILL — traZODone HCL 50 MG TABS: 50 | 90 days supply | Qty: 270 | Fill #0

## 2017-08-02 MED FILL — guanFACINE HCL ER 1 MG TB24: 1 | 90 days supply | Qty: 90 | Fill #1

## 2017-08-02 MED FILL — DULERA 100 MCG/5 MCG INH: 100-5 | 30 days supply | Qty: 13 | Fill #2

## 2017-10-17 MED FILL — DULERA 100 MCG/5 MCG INH: 100-5 | 30 days supply | Qty: 13 | Fill #3

## 2017-10-17 MED FILL — VENTOLIN HFA 90 MCG INHALER: 108 (90 BAS | 16 days supply | Qty: 18 | Fill #1

## 2017-10-25 ENCOUNTER — Encounter: Payer: Self-pay | Admitting: Family Medicine

## 2017-10-25 ENCOUNTER — Ambulatory Visit (INDEPENDENT_AMBULATORY_CARE_PROVIDER_SITE_OTHER): Payer: 59 | Admitting: Family Medicine

## 2017-10-25 VITALS — BP 122/80 | HR 119 | Temp 98.5°F | Ht 67.0 in | Wt 145.8 lb

## 2017-10-25 DIAGNOSIS — F411 Generalized anxiety disorder: Secondary | ICD-10-CM

## 2017-10-25 DIAGNOSIS — Z0000 Encounter for general adult medical examination without abnormal findings: Secondary | ICD-10-CM

## 2017-10-25 DIAGNOSIS — Z13220 Encounter for screening for lipoid disorders: Secondary | ICD-10-CM | POA: Diagnosis not present

## 2017-10-25 DIAGNOSIS — R634 Abnormal weight loss: Secondary | ICD-10-CM

## 2017-10-25 DIAGNOSIS — Z79899 Other long term (current) drug therapy: Secondary | ICD-10-CM

## 2017-10-25 DIAGNOSIS — F988 Other specified behavioral and emotional disorders with onset usually occurring in childhood and adolescence: Secondary | ICD-10-CM

## 2017-10-25 NOTE — Patient Instructions (Addendum)
Schedule a lab visit at the check out desk within 2 weeks. Return for future fasting labs meaning nothing but water after midnight please. Ok to take your medications with water.    No other changes- you are doing great. Congrats on the new job!   Lets consider sports medicine referral if not doing better over next month for left wrist.

## 2017-10-25 NOTE — Progress Notes (Signed)
Phone: [phone]  Subjective:  Patient presents today for their annual physical. Chief complaint-noted.   See problem oriented charting- ROS- full  review of systems was completed and negative including No chest pain. No worsening shortness of breath/wheeze. No headache or blurry vision. Thinks needs to get checked for glasses, some wrist ain, low back pain, seasonal allergies, bruises easily  The following were reviewed and entered/updated in epic: Past Medical History:  Diagnosis Date  . ADHD (attention deficit hyperactivity disorder) 03/18/2015   Dr. Toy Care psychiatry. ? OCD taking Adderall XR 65m. May be changing due to anxiety   . Dysmenorrhea 03/18/2015   Birth control- Junel improved this.  As of 03/18/15 in homosexual relationship and not active with males-trialing off. Will see if has regular periods. Concerned about libido.    .Marland KitchenExercise-induced asthma   . Generalized anxiety disorder 01/18/2014   Dr. KToy Carepsychiatry. BDewitt Rotafor counseling. Xanax 0.518mBID prn.  Trazodone for sleep SSRI tried (lexapro) and felt "numb" and got speeding ticket and didn't care    Patient Active Problem List   Diagnosis Date Noted  . Asthma 01/16/2016    Priority: Medium  . Attention deficit disorder (ADD) in adult 03/18/2015    Priority: Medium  . Dysmenorrhea 03/18/2015    Priority: Medium  . Generalized anxiety disorder 01/18/2014    Priority: Medium  . Allergic rhinitis 01/16/2016    Priority: Low   Past Surgical History:  Procedure Laterality Date  . ADENOIDECTOMY    . ankle sugery    . TYMPANOSTOMY     1 set    Family History  Problem Relation Age of Onset  . ADD / ADHD Mother   . Allergic rhinitis Mother   . Asthma Mother   . Hypertension Father   . Cancer Maternal Aunt        breast- late 4078s. Cancer Maternal Grandmother        breast-late 40s    Medications- reviewed and updated Current Outpatient Medications  Medication Sig Dispense Refill  . albuterol  (PROVENTIL HFA;VENTOLIN HFA) 108 (90 Base) MCG/ACT inhaler Use 2 puffs every four hours as needed for cough or wheeze.  May use 2 puffs 10-20 minutes prior to exercise. 1 Inhaler 1  . amphetamine-dextroamphetamine (ADDERALL XR) 20 MG 24 hr capsule Take 20 mg by mouth daily.    . Marland Kitchenmphetamine-dextroamphetamine (ADDERALL) 20 MG tablet   0  . etonogestrel (NEXPLANON) 68 MG IMPL implant 68 mg by Subdermal route once.    . Marland KitchenuanFACINE (INTUNIV) 1 MG TB24 ER tablet   4  . mometasone-formoterol (DULERA) 100-5 MCG/ACT AERO Use 2 puffs twice daily to prevent cough or wheeze.  Rinse, gargle, and spit after use. 1 Inhaler 3  . Multiple Vitamin (MULTIVITAMIN) tablet Take 1 tablet by mouth daily.    . traZODone (DESYREL) 50 MG tablet     . VENTOLIN HFA 108 (90 Base) MCG/ACT inhaler INHALE 2 PUFFS BY MOUTH INTO THE LUNGS EVERY 6 HOURS AS NEEDED FOR WHEEZING OR SHORTNESS OF BREATH 18 g 5   No current facility-administered medications for this visit.     Allergies-reviewed and updated No Known Allergies  Social History   Socioeconomic History  . Marital status: Single    Spouse name: None  . Number of children: None  . Years of education: None  . Highest education level: None  Social Needs  . Financial resource strain: None  . Food insecurity - worry: None  . Food insecurity -  inability: None  . Transportation needs - medical: None  . Transportation needs - non-medical: None  Occupational History  . None  Tobacco Use  . Smoking status: Never Smoker  . Smokeless tobacco: Never Used  Substance and Sexual Activity  . Alcohol use: Yes    Comment: occasional  . Drug use: No  . Sexual activity: Yes    Partners: Female, Female    Birth control/protection: Pill  Other Topics Concern  . None  Social History Narrative   75/25 between parents and boyfriend house.       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 completed May 2018.    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       Hobbies: sewing class 2018, has a dog    Objective: BP 122/80 (BP Location: Left Arm, Patient Position: Sitting, Cuff Size: Normal)   Pulse (!) 119   Temp 98.5 F (36.9 C) (Oral)   Ht '5\' 7"'  (1.702 m)   Wt 145 lb 12.8 oz (66.1 kg)   LMP 09/21/2017   SpO2 99%   BMI 22.84 kg/m  Gen: NAD, resting comfortably HEENT: Mucous membranes are moist. Oropharynx normal. Tm obscured on right by cerumen- no hearing loss Neck: no thyromegaly CV: RRR no murmurs rubs or gallops Lungs: CTAB no crackles, wheeze, rhonchi Abdomen: soft/nontender/nondistended/normal bowel sounds. No rebound or guarding.  Ext: no edema Skin: warm, dry Neuro: grossly normal, moves all extremities, PERRLA  Assessment/Plan:  26 y.o. female presenting for annual physical.  Health Maintenance counseling: 1. Anticipatory guidance: Patient counseled regarding regular dental exams -q6 months- will restart, eye exams - wears contacts, wearing seatbelts.  2. Risk factor reduction:  Advised patient of need for regular exercise and diet rich and fruits and vegetables to reduce risk of heart attack and stroke. Exercise- has started classes at [employer] and enjoying. Diet-harder to eat with school/work now and her schedule.  Wt Readings from Last 3 Encounters:  10/25/17 145 lb 12.8 oz (66.1 kg)  01/27/17 146 lb (66.2 kg)  04/22/16 157 lb (71.2 kg)  3. Immunizations/screenings/ancillary studies- up to date  Immunization History  Administered Date(s) Administered  . Hepatitis B, adult 10/11/2013, 11/09/2013, 04/16/2014  . Influenza,inj,Quad PF,6+ Mos 09/25/2013  . Influenza-Unspecified 09/07/2015, 09/02/2017  . MMR 10/11/2013  . PPD Test 09/25/2013, 10/22/2013, 03/18/2015, 04/16/2015, 04/09/2016  . Tdap 09/27/2013  . Varicella 03/18/2015  4. Cervical cancer screening- 07/15/16 with 3 year repeat. Appears transformation zone not present. Wants to wait on next year for  next pap due to being on focus plan. May need referral.  5. Breast cancer screening-  breast exam with GYN and mammogram - likely start regularly at 35-40 given family history 6. Colon cancer screening - no family history, start at age 31 7. Skin cancer screening- advised regular sunscreen use. Denies worrisome, changing, or new skin lesions.  8. Birth control/STD check- [facility]. Declines STD screen for now- has been in long term relationship  Status of chronic or acute concerns    Preventative health care - Plan: Lipid panel, CBC, Comprehensive metabolic panel, TSH  Screening for hyperlipidemia - Plan: Lipid panel  Encounter for long-term (current) use of medications - Plan: CBC, Comprehensive metabolic panel, TSH  Unintentional weight loss - Plan: CBC, Comprehensive metabolic panel, TSH Weight down  12 lbs- thinks may be because  doesn't have time to eat at work  Care team- [facility] GYN Dr. Ouida Sills, Dr. Ernst Bowler with allergy/asthma, Dr. Robina Ade psychiatry but pays out of pocket  Allergist Dr. Ernst Bowler - on [place]- rarely uses outside of pre exercise  Left wrist pain  S: Hurts at various time such as if holding head with hand. Hard to tell when it will get triggered. . Sometimes feels a click lateral wrist. Dorsal side - medial of midline. But also hurts lateral side at times. Hears click at times. Ibuprofen helps. Symptoms  For at least 2 months- worse over last month.  Used to inline speed skate. Massage therapist sees her and helping some A/P: discussed SM referral with Dr. Paulla Fore- she declines for now but will let me know if changes her mind  Attention deficit disorder (ADD) in adult Somewhat atypical regimen but patient states working well  Up to 2 of extended release 7m adderrall mg Also has instant release 228m max 40 any given day whether XR and IR or XR and XR Being weaned from intuniv  Generalized anxiety disorder Remains with Dr.  KaToy CareFelt numb on SSRI- on xanax only at this point for anxiety through psychiatry Trazodone still helps with sleep  1 year CPE  Orders Placed This Encounter  Procedures  . Lipid panel    Standing Status:   Future    Number of Occurrences:   1    Standing Expiration Date:   10/25/2018  . CBC    Standing Status:   Future    Number of Occurrences:   1    Standing Expiration Date:   10/25/2018  . Comprehensive metabolic panel    [place]    Standing Status:   Future    Number of Occurrences:   1    Standing Expiration Date:   10/25/2018  . TSH    Standing Status:   Future    Number of Occurrences:   1    Standing Expiration Date:   10/25/2018   Return precautions advised.  StGarret ReddishMD

## 2017-10-26 ENCOUNTER — Other Ambulatory Visit (INDEPENDENT_AMBULATORY_CARE_PROVIDER_SITE_OTHER): Payer: 59

## 2017-10-26 DIAGNOSIS — Z79899 Other long term (current) drug therapy: Secondary | ICD-10-CM | POA: Diagnosis not present

## 2017-10-26 DIAGNOSIS — Z13220 Encounter for screening for lipoid disorders: Secondary | ICD-10-CM | POA: Diagnosis not present

## 2017-10-26 DIAGNOSIS — Z0000 Encounter for general adult medical examination without abnormal findings: Secondary | ICD-10-CM | POA: Diagnosis not present

## 2017-10-26 DIAGNOSIS — R634 Abnormal weight loss: Secondary | ICD-10-CM

## 2017-10-26 LAB — LIPID PANEL
CHOL/HDL RATIO: 3
CHOLESTEROL: 169 mg/dL (ref 0–200)
HDL: 59.6 mg/dL (ref >39.00)
LDL CALC: 91 mg/dL (ref 0–99)
NonHDL: 109.37
TRIGLYCERIDES: 90 mg/dL (ref 0.0–149.0)
VLDL: 18 mg/dL (ref 0.0–40.0)

## 2017-10-26 LAB — COMPREHENSIVE METABOLIC PANEL
ALT: 11 U/L (ref 0–35)
AST: 15 U/L (ref 0–37)
Albumin: 5.1 g/dL (ref 3.5–5.2)
Alkaline Phosphatase: 42 U/L (ref 39–117)
BUN: 11 mg/dL (ref 6–23)
CHLORIDE: 103 meq/L (ref 96–112)
CO2: 28 meq/L (ref 19–32)
Calcium: 9.9 mg/dL (ref 8.4–10.5)
Creatinine, Ser: 0.83 mg/dL (ref 0.40–1.20)
GFR: 89.13 mL/min (ref >60.00)
GLUCOSE: 84 mg/dL (ref 70–99)
POTASSIUM: 4.4 meq/L (ref 3.5–5.1)
SODIUM: 139 meq/L (ref 135–145)
Total Bilirubin: 0.9 mg/dL (ref 0.2–1.2)
Total Protein: 7.3 g/dL (ref 6.0–8.3)

## 2017-10-26 LAB — CBC
HEMATOCRIT: 43.2 % (ref 36.0–46.0)
Hemoglobin: 14.4 g/dL (ref 12.0–15.0)
MCHC: 33.3 g/dL (ref 30.0–36.0)
MCV: 92 fl (ref 78.0–100.0)
PLATELETS: 254 10*3/uL (ref 150.0–400.0)
RBC: 4.69 Mil/uL (ref 3.87–5.11)
RDW: 13.8 % (ref 11.5–15.5)
WBC: 6.2 10*3/uL (ref 4.0–10.5)

## 2017-10-26 LAB — TSH: TSH: 2.14 u[IU]/mL (ref 0.35–4.50)

## 2017-10-26 NOTE — Assessment & Plan Note (Signed)
Remains with Dr. Evelene CroonKaur. Felt numb on SSRI- on xanax only at this point for anxiety through psychiatry

## 2017-10-26 NOTE — Assessment & Plan Note (Signed)
Somewhat atypical regimen but patient states working well  Up to 2 of extended release 20mg  adderrall mg Also has instant release 20mg - max 40 any given day whether XR and IR or XR and XR Being weaned from Mex[place]ointuniv

## 2017-11-17 MED FILL — traZODone HCL 50 MG TABS: 50 | 90 days supply | Qty: 270 | Fill #1

## 2017-11-30 ENCOUNTER — Encounter: Payer: Self-pay | Admitting: Family Medicine

## 2017-12-01 ENCOUNTER — Other Ambulatory Visit: Payer: Self-pay

## 2017-12-01 DIAGNOSIS — M25532 Pain in left wrist: Secondary | ICD-10-CM

## 2017-12-01 DIAGNOSIS — J45909 Unspecified asthma, uncomplicated: Secondary | ICD-10-CM

## 2017-12-01 DIAGNOSIS — Z0100 Encounter for examination of eyes and vision without abnormal findings: Secondary | ICD-10-CM

## 2017-12-08 ENCOUNTER — Telehealth: Payer: Self-pay | Admitting: Family Medicine

## 2017-12-08 NOTE — Telephone Encounter (Signed)
See note

## 2017-12-08 NOTE — Telephone Encounter (Signed)
Called and spoke with patient. She stated [facility] always calls when she is at work. She stated she will call them and schedule an appointment

## 2017-12-08 NOTE — Telephone Encounter (Signed)
Copied from CRM [id number]. Topic: General - Other >> Dec 08, 2017  9:35 AM Gerrianne ScalePayne, Aniesa Boback L wrote: Reason for CRM: [facility] [phone] is calling to let Dr Durene CalHunter know that they have tried over and over to reach out to pt about her referral and they can't get in touch with her due to mailbox full

## 2017-12-21 ENCOUNTER — Other Ambulatory Visit: Payer: Self-pay | Admitting: Allergy & Immunology

## 2017-12-22 ENCOUNTER — Ambulatory Visit: Payer: No Typology Code available for payment source | Admitting: Allergy & Immunology

## 2017-12-22 ENCOUNTER — Encounter: Payer: Self-pay | Admitting: Allergy & Immunology

## 2017-12-22 VITALS — BP 110/72 | HR 82 | Ht 67.0 in | Wt 145.0 lb

## 2017-12-22 DIAGNOSIS — J302 Other seasonal allergic rhinitis: Secondary | ICD-10-CM | POA: Diagnosis not present

## 2017-12-22 DIAGNOSIS — J4540 Moderate persistent asthma, uncomplicated: Secondary | ICD-10-CM

## 2017-12-22 DIAGNOSIS — J3089 Other allergic rhinitis: Secondary | ICD-10-CM | POA: Diagnosis not present

## 2017-12-22 MED ORDER — MOMETASONE FURO-FORMOTEROL FUM 100-5 MCG/ACT IN AERO: 2.0000 | INHALATION_SPRAY | Freq: Two times a day (BID) | RESPIRATORY_TRACT | 5 refills | Status: DC

## 2017-12-22 MED ORDER — MONTELUKAST SODIUM 10 MG PO TABS: 10.0000 mg | ORAL_TABLET | Freq: Every day | ORAL | 5 refills | Status: DC

## 2017-12-22 MED FILL — DULERA 100 MCG/5 MCG INH: 100-5 | 30 days supply | Qty: 13 | Fill #0

## 2017-12-22 MED FILL — MONTELUKAST SOD 10 MG TAB: 10 | 30 days supply | Qty: 30 | Fill #0

## 2017-12-22 NOTE — Progress Notes (Signed)
FOLLOW UP  Date of Service/Encounter:  12/22/17   Assessment:   Moderate persistent asthma, uncomplicated  Seasonal and perennial allergic rhinitis (grasses, weeds, molds)   Asthma Reportables:  Severity: moderate persistent  Risk: low Control: well controlled   Plan/Recommendations:   1. Moderate persistent asthma, uncomplicated - Spirometry looks good today. - We could consider decreasing her controller medication at the next visit, especially in light of the fact that she has not had a spacer the entire time. - Therefore, without the spacer, we are unsure how much of the medication she has indeed been getting anyway.   - We will add on montelukast (Singulair) 10mg  once daily to help with exercise induced bronchospasm and help with tolerating physical activity in cold weather.  - Daily controller medication(s): Dulera 100/5 two puffs twice daily with spacer - Rescue medications: ProAir 4 puffs every 4-6 hours as needed - Asthma control goals:  * Full participation in all desired activities (may need albuterol before activity) * Albuterol use two time or less a week on average (not counting use with activity) * Cough interfering with sleep two time or less a month * Oral steroids no more than once a year * No hospitalizations  2. Perennial allergic rhinitis - Continue with the nasal steroid two sprays per nostril as needed. - Continue with Allegra 180mg  once daily (you can try getting off of this since we are adding on the Singulair, which provide some coverage of allergies)  3. Return in about 6 months (around 06/21/2018).  Subjective:   Katherine Wise is a 27 y.o. female presenting today for follow up of  Chief Complaint  Patient presents with  . Follow-up    Pt presents for follow up of asthma. Pt states she is controlled and inhaler is working well.    Katherine Wise has a history of the following: Patient Active Problem List   Diagnosis Date Noted  .  Asthma 01/16/2016  . Allergic rhinitis 01/16/2016  . Attention deficit disorder (ADD) in adult 03/18/2015  . Dysmenorrhea 03/18/2015  . Generalized anxiety disorder 01/18/2014    History obtained from: chart review and patient.  Katherine Wise's Primary Care Provider is Shelva MajesticHunter, Stephen O, MD.     Katherine Wise is a 27 y.o. delightful female presenting for a follow up visit. She was last seen in March 2018. At that time, she was doing very well on her regimen of Dulera 100/5 two puffs BID. She has a history of allergic rhinitis. We also continued her on her allegra one tablet daily. We did provide her with a sample of Dymista. Her last testing was performed in June 2017 and was positive to weeds, grasses, mold mix 1, and mold mix 2.  ince the last visit, she has mostly done well. She remains on Dulera 100/5 two puffs twice daily. She feels that this is working fairly well. The dry air of the winter makes her symptoms worse. She has not been on the Singulair at all. Tenesha's asthma has been well controlled. She has not required rescue medication, experienced nocturnal awakenings due to lower respiratory symptoms, nor have activities of daily living been limited. She has required no Emergency Department or Urgent Care visits for her asthma. She has required zero courses of systemic steroids for asthma exacerbations since the last visit. ACT score today is 22, indicating excellent asthma symptom control. She does endorse some symptoms with physical activity. She will be starting a twice weekly running class  shortly and is nervous about worsening symptoms with this. Of note, she does not have a spacer at all and has been using her Dulera without a spacer since she started being seen by [place].   She remains on the some kind of nasal steroid with good results. She uses this only as needed. She is on an oral antihistamines, alternating between Zyrtec and Allegra. She has failed Xyzal. She has had no sinus infections and  is overall doing very well. Despite her last testing, she continues to feel that she does have some subclinical allergic symptoms to her dog, but she is not going to attempt to get rid of the dog.   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 She is doing this for one year. She is under supervision until this next fall. In the interim, her dog ate two inhalers (albuterol and Dulera). Apparently the vials of medication do not burst when pierced by dog teeth, surprisingly.   Otherwise, there have been no changes to her past medical history, surgical history, family history, or social history.    Review of Systems: a 14-point review of systems is pertinent for what is mentioned in HPI.  Otherwise, all other systems were negative. Constitutional: negative other than that listed in the HPI Eyes: negative other than that listed in the HPI Ears, nose, mouth, throat, and face: negative other than that listed in the HPI Respiratory: negative other than that listed in the HPI Cardiovascular: negative other than that listed in the HPI Gastrointestinal: negative other than that listed in the HPI Genitourinary: negative other than that listed in the HPI Integument: negative other than that listed in the HPI Hematologic: negative other than that listed in the HPI Musculoskeletal: negative other than that listed in the HPI Neurological: negative other than that listed in the HPI Allergy/Immunologic: negative other than that listed in the HPI    Objective:   Blood pressure 110/72, pulse 82, height 5\' 7"  (1.702 m), weight 145 lb (65.8 kg), SpO2 98 %. Body mass index is 22.71 kg/m.   Physical Exam:  General: Alert, interactive, in no acute distress. Pleasant female. Bubbly.  Eyes: No conjunctival injection bilaterally, no discharge on the right, no discharge on the left and no Horner-Trantas dots present. PERRL bilaterally. EOMI without pain. No photophobia.  Ears: Right TM  pearly gray with normal light reflex, Left TM pearly gray with normal light reflex, Right TM intact without perforation and Left TM intact without perforation.  Nose/Throat: External nose within normal limits and septum midline. Turbinates edematous without discharge. Posterior oropharynx mildly erythematous without cobblestoning in the posterior oropharynx. Tonsils 2+ without exudates.  Tongue without thrush. Adenopathy: no enlarged lymph nodes appreciated in the anterior cervical, occipital, axillary, epitrochlear, inguinal, or popliteal regions. Lungs: Clear to auscultation without wheezing, rhonchi or rales. No increased work of breathing. CV: Normal S1/S2. No murmurs. Capillary refill <2 seconds.  Skin: Warm and dry, without lesions or rashes. Neuro:   Grossly intact. No focal deficits appreciated. Responsive to questions.  Diagnostic studies:   Spirometry: results normal (FEV1: 4.08/113%, FVC: 4.53/107%, FEV1/FVC: 90%).    Spirometry consistent with normal pattern.   Allergy Studies: none    Malachi Bonds, MD [facility]

## 2017-12-22 NOTE — Patient Instructions (Addendum)
1. Moderate persistent asthma, uncomplicated - Spirometry looks good today. - We will add on montelukast (Singulair) 10mg  once daily.  - Daily controller medication(s): Dulera 100/5 two puffs twice daily with spacer - Rescue medications: ProAir 4 puffs every 4-6 hours as needed - Asthma control goals:  * Full participation in all desired activities (may need albuterol before activity) * Albuterol use two time or less a week on average (not counting use with activity) * Cough interfering with sleep two time or less a month * Oral steroids no more than once a year * No hospitalizations  2. Perennial allergic rhinitis - Continue with the nasal steroid two sprays per nostril as needed. - Continue with Allegra 180mg  once daily (you can try getting off of this since we are adding on the Singulair, which provide some coverage of allergies)  3. Return in about 6 months (around 06/21/2018).   Please inform us of any Emergency Department visits, hospitalizations, or changes in symptoms. Call us before going to the ED for breathing or allergy symptoms since we might be able to fit you in for a sick visit. Feel free to contact us anytime with any questions, problems, or concerns.  It was a pleasure to see you again today! Happy New Year!   Websites that have reliable patient information: 1. American Academy of Asthma, Allergy, and Immunology: www.aaaai.org 2. Food Allergy Research and Education (FARE): foodallergy.org 3. Mothers of Asthmatics: http://www.asthmacommunitynetwork.org 4. American College of Allergy, Asthma, and Immunology: www.acaai.org

## 2018-01-23 MED FILL — MONTELUKAST SOD 10 MG TAB: 10 | 30 days supply | Qty: 30 | Fill #1

## 2018-01-23 MED FILL — DULERA 100 MCG/5 MCG INH: 100-5 | 30 days supply | Qty: 13 | Fill #1

## 2018-01-26 MED FILL — AMPHETAMINE-DEXTRO 20MG: 20 | 90 days supply | Qty: 90 | Fill #0

## 2018-01-26 MED FILL — ADDERALL XR 20 MG CAP SA: 20 | 90 days supply | Qty: 180 | Fill #0

## 2018-02-24 MED FILL — MONTELUKAST SOD 10 MG TAB: 10 | 90 days supply | Qty: 90 | Fill #2

## 2018-02-24 MED FILL — VENTOLIN HFA 90 MCG INHALER: 108 (90 BAS | 25 days supply | Qty: 18 | Fill #0

## 2018-03-16 ENCOUNTER — Encounter: Payer: Self-pay | Admitting: Family Medicine

## 2018-03-17 ENCOUNTER — Ambulatory Visit: Payer: Self-pay | Admitting: Family Medicine

## 2018-03-17 ENCOUNTER — Ambulatory Visit (INDEPENDENT_AMBULATORY_CARE_PROVIDER_SITE_OTHER): Payer: No Typology Code available for payment source | Admitting: Family Medicine

## 2018-03-17 ENCOUNTER — Encounter: Payer: Self-pay | Admitting: Family Medicine

## 2018-03-17 VITALS — BP 118/74 | HR 105 | Temp 98.2°F | Ht 67.0 in | Wt 140.8 lb

## 2018-03-17 DIAGNOSIS — M25512 Pain in left shoulder: Secondary | ICD-10-CM | POA: Diagnosis not present

## 2018-03-17 NOTE — Patient Instructions (Addendum)
I dont see an obvious rotator cuff issue on your exam  I do feel some coarseness with leaning back and with your feeling of an unstable joint- I want you to see orthopedics. We are trying to get you in today through Tuesday.   We should give you a call by Monday at latest. Call us if you dont hear by Monday afternoon

## 2018-03-17 NOTE — Progress Notes (Addendum)
Subjective:  Katherine Wise is a 27 y.o. year old very pleasant female patient who presents for/with See problem oriented charting ROS-no chest pain or shortness of breath.  No abdominal pain.  No nausea or vomiting.  Does feel some instability in the left shoulder  Past Medical History-  Patient Active Problem List   Diagnosis Date Noted  . Asthma 01/16/2016    Priority: Medium  . Attention deficit disorder (ADD) in adult 03/18/2015    Priority: Medium  . Dysmenorrhea 03/18/2015    Priority: Medium  . Generalized anxiety disorder 01/18/2014    Priority: Medium  . Allergic rhinitis 01/16/2016    Priority: Low    Medications- reviewed and updated Current Outpatient Medications  Medication Sig Dispense Refill  . albuterol (PROVENTIL HFA;VENTOLIN HFA) 108 (90 Base) MCG/ACT inhaler Use 2 puffs every four hours as needed for cough or wheeze.  May use 2 puffs 10-20 minutes prior to exercise. 1 Inhaler 1  . amphetamine-dextroamphetamine (ADDERALL XR) 20 MG 24 hr capsule Take 20 mg by mouth daily.    Marland Kitchen. etonogestrel (NEXPLANON) 68 MG IMPL implant 68 mg by Subdermal route once.    Marland Kitchen. guanFACINE (INTUNIV) 1 MG TB24 ER tablet   4  . mometasone-formoterol (DULERA) 100-5 MCG/ACT AERO Inhale 2 puffs into the lungs 2 (two) times daily. 1 Inhaler 5  . montelukast (SINGULAIR) 10 MG tablet Take 1 tablet (10 mg total) by mouth at bedtime. 30 tablet 5  . Multiple Vitamin (MULTIVITAMIN) tablet Take 1 tablet by mouth daily.    . traZODone (DESYREL) 50 MG tablet      No current facility-administered medications for this visit.     Objective: BP 118/74 (BP Location: Left Arm, Patient Position: Sitting, Cuff Size: Normal)   Pulse (!) 105   Temp 98.2 F (36.8 C) (Oral)   Ht 5\' 7"  (1.702 m)   Wt 140 lb 12.8 oz (63.9 kg)   SpO2 99%   BMI 22.05 kg/m  Gen: NAD, resting comfortably CV: slightly tachycardic- have noted in past as well Lungs: nonlabored, normal repiratory rate Ext: no edema Skin: warm,  dry Neuro: intact distal sensation  Right shoulder normal. Left Shoulder: Inspection reveals no abnormalities, atrophy or asymmetry. Palpation is normal with no tenderness over AC joint or bicipital groove. ROM is full in all planes. Rotator cuff strength normal throughout. No signs of impingement with negative Neer and Hawkin's tests, empty can. Speeds and Yergason's tests normal. When she leans back with hands around knee- she appears to guard somewhat. Do feel some crepitus in shoulder on this exam maneuver.  No painful arc and no drop arm sign. 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 Started while at work but was just walking and felt the pain. Pain level 1/10. No chest pain or shortness of breath. Seems to be worse with walking and letting arm hang down. Feels like arm is going to "fall out of socket" and feels unstable. Saw PA at work who thought could be rotator cuff. Does have 70 pound dog and does kind of tug on her when walking.  A/P: from avs "I dont see an obvious rotator cuff issue on your exam  I do feel some coarseness with leaning back and with your feeling of an unstable joint- I want you to see orthopedics. We are trying to get you in today through Tuesday.  We should give you a call by Monday at latest. Call us if you dont hear by Monday afternoon"  Discussed x-rays but since going to see orthopedics and they will likely repeat- opted out.   Future Appointments  Date Time Provider Department Center  03/21/2018  8:45 AM Tarry Kos, MD [facility] None  06/22/2018  8:30 AM Alfonse Spruce, MD [facility] None   Lab/Order associations: Acute pain of left shoulder - Plan: Ambulatory referral to Orthopedics  Return precautions advised.  Tana Conch, MD

## 2018-03-17 NOTE — Progress Notes (Deleted)
Subjective:  Katherine Wise is a 27 y.o. year old very pleasant female patient who presents for/with See problem oriented charting ROS- ***   Past Medical History-  Patient Active Problem List   Diagnosis Date Noted  . Asthma 01/16/2016    Priority: Medium  . Attention deficit disorder (ADD) in adult 03/18/2015    Priority: Medium  . Dysmenorrhea 03/18/2015    Priority: Medium  . Generalized anxiety disorder 01/18/2014    Priority: Medium  . Allergic rhinitis 01/16/2016    Priority: Low    Medications- reviewed and updated Current Outpatient Medications  Medication Sig Dispense Refill  . albuterol (PROVENTIL HFA;VENTOLIN HFA) 108 (90 Base) MCG/ACT inhaler Use 2 puffs every four hours as needed for cough or wheeze.  May use 2 puffs 10-20 minutes prior to exercise. 1 Inhaler 1  . amphetamine-dextroamphetamine (ADDERALL XR) 20 MG 24 hr capsule Take 20 mg by mouth daily.    Marland Kitchen. etonogestrel (NEXPLANON) 68 MG IMPL implant 68 mg by Subdermal route once.    Marland Kitchen. guanFACINE (INTUNIV) 1 MG TB24 ER tablet   4  . mometasone-formoterol (DULERA) 100-5 MCG/ACT AERO Use 2 puffs twice daily to prevent cough or wheeze.  Rinse, gargle, and spit after use. 1 Inhaler 3  . mometasone-formoterol (DULERA) 100-5 MCG/ACT AERO Inhale 2 puffs into the lungs 2 (two) times daily. 1 Inhaler 5  . montelukast (SINGULAIR) 10 MG tablet Take 1 tablet (10 mg total) by mouth at bedtime. 30 tablet 5  . Multiple Vitamin (MULTIVITAMIN) tablet Take 1 tablet by mouth daily.    . traZODone (DESYREL) 50 MG tablet     . VENTOLIN HFA 108 (90 Base) MCG/ACT inhaler INHALE 2 PUFFS BY MOUTH INTO THE LUNGS EVERY 6 HOURS AS NEEDED FOR WHEEZING OR SHORTNESS OF BREATH 18 g 5   No current facility-administered medications for this visit.     Objective: There were no vitals taken for this visit. Gen: NAD, resting comfortably CV: RRR no murmurs rubs or gallops Lungs: CTAB no crackles, wheeze, rhonchi Abdomen:  soft/nontender/nondistended/normal bowel sounds. No rebound or guarding.  Ext: no edema Skin: warm, dry Neuro: grossly normal, moves all extremities  ***  Assessment/Plan:  Left shoulder pain  s: *** A/P:  No problem-specific Assessment & Plan notes found for this encounter.   Future Appointments  Date Time Provider Department Center  03/17/2018  8:15 AM Shelva MajesticHunter, Stephen O, MD [facility]  06/22/2018  8:30 AM Alfonse SpruceGallagher, Joel Louis, MD [facility] None   No follow-ups on file.  Lab/Order associations: No diagnosis found.  No orders of the defined types were placed in this encounter.   Return precautions advised.  Tana ConchStephen Hunter, MD

## 2018-03-21 ENCOUNTER — Ambulatory Visit (INDEPENDENT_AMBULATORY_CARE_PROVIDER_SITE_OTHER): Payer: No Typology Code available for payment source

## 2018-03-21 ENCOUNTER — Encounter (INDEPENDENT_AMBULATORY_CARE_PROVIDER_SITE_OTHER): Payer: Self-pay | Admitting: Orthopaedic Surgery

## 2018-03-21 ENCOUNTER — Ambulatory Visit (INDEPENDENT_AMBULATORY_CARE_PROVIDER_SITE_OTHER): Payer: No Typology Code available for payment source | Admitting: Orthopaedic Surgery

## 2018-03-21 DIAGNOSIS — M25512 Pain in left shoulder: Secondary | ICD-10-CM | POA: Diagnosis not present

## 2018-03-21 MED ORDER — NAPROXEN 500 MG PO TABS: 500.0000 mg | ORAL_TABLET | Freq: Two times a day (BID) | ORAL | 3 refills | Status: DC

## 2018-03-21 MED ORDER — CYCLOBENZAPRINE HCL 5 MG PO TABS: 5.0000 mg | ORAL_TABLET | Freq: Every day | ORAL | 3 refills | Status: DC | PRN

## 2018-03-21 NOTE — Progress Notes (Signed)
Office Visit Note   Patient: Katherine Wise           Date of Birth: October 11, 1991           MRN: [id number] Visit Date: 03/21/2018              Requested by: Katherine Majestic, MD [address] [place], [place] [address] PCP: Katherine Majestic, MD   Assessment & Plan: Visit Diagnoses:  1. Acute pain of left shoulder     Plan: Impression is acute left shoulder pain with gradual improvement.  From my standpoint I am not exactly able to pinpoint the etiology of her pain.  I think this is something muscular in nature.  I recommend relative rest and continue Naprosyn and Flexeril.  I would expect this to continue to get better over the next couple weeks.  If not better patient instructed to follow-up.  Questions encouraged and answered.  Follow-Up Instructions: Return if symptoms worsen or fail to improve.   Orders:  Orders Placed This Encounter  Procedures  . XR Shoulder Left   Meds ordered this encounter  Medications  . naproxen (NAPROSYN) 500 MG tablet    Sig: Take 1 tablet (500 mg total) by mouth 2 (two) times daily with a meal.    Dispense:  30 tablet    Refill:  3  . cyclobenzaprine (FLEXERIL) 5 MG tablet    Sig: Take 1 tablet (5 mg total) by mouth daily as needed for muscle spasms.    Dispense:  30 tablet    Refill:  3      Procedures: No procedures performed   Clinical Data: No additional findings.   Subjective: Chief Complaint  Patient presents with  . 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  She denies any injuries.  She states that she was not doing anything in particular when she all of a sudden had acute onset of left shoulder pain in the deltoid region.  It felt like there was a burning sensation down her arm.  She currently denies any numbness and tingling.  Denies any radicular symptoms or neck pain.  She denies any  mechanical symptoms.  Denies any constitutional symptoms.  This has overall been improving and it does have good relief from Aleve.   Review of Systems  Constitutional: Negative.   HENT: Negative.   Eyes: Negative.   Respiratory: Negative.   Cardiovascular: Negative.   Endocrine: Negative.   Musculoskeletal: Negative.   Neurological: Negative.   Hematological: Negative.   Psychiatric/Behavioral: Negative.   All other systems reviewed and are negative.    Objective: Vital Signs: There were no vitals taken for this visit.  Physical Exam  Constitutional: She is oriented to person, place, and time. She appears well-developed and well-nourished.  HENT:  Head: Normocephalic and atraumatic.  Eyes: EOM are normal.  Neck: Neck supple.  Pulmonary/Chest: Effort normal.  Abdominal: Soft.  Neurological: She is alert and oriented to person, place, and time.  Skin: Skin is warm. Capillary refill takes less than 2 seconds.  Psychiatric: She has a normal mood and affect. Her behavior is normal. Judgment and thought content normal.  Nursing note and vitals reviewed.   Ortho Exam Left shoulder exam shows full painless range of motion without any crepitus.  No real reproducible pain with palpation.  Motor and sensory function are all intact and normal.  Negative Spurling's. Specialty Comments:  No specialty comments available.  Imaging: Xr Shoulder Left  Result Date: 03/21/2018 No acute or structural abnormalities    PMFS History: Patient Active Problem List   Diagnosis Date Noted  . Asthma 01/16/2016  . Allergic rhinitis 01/16/2016  . Attention deficit disorder (ADD) in adult 03/18/2015  . Dysmenorrhea 03/18/2015  . Generalized anxiety disorder 01/18/2014   Past Medical History:  Diagnosis Date  . ADHD (attention deficit hyperactivity disorder) 03/18/2015   Dr. Evelene Croon psychiatry. ? OCD taking Adderall XR . May be changing due to anxiety   . Dysmenorrhea 03/18/2015   Birth  control- Junel improved this.  As of 03/18/15 in homosexual relationship and not active with males-trialing off. Will see if has regular periods. Concerned about libido.    Marland Kitchen Exercise-induced asthma   . Generalized anxiety disorder 01/18/2014   Dr. Evelene Croon psychiatry. Lawerance Bach for counseling. Xanax 0.5mg  BID prn.  Trazodone for sleep SSRI tried (lexapro) and felt "numb" and got speeding ticket and didn't care     Family History  Problem Relation Age of Onset  . ADD / ADHD Mother   . Allergic rhinitis Mother   . Asthma Mother   . Hypertension Father   . Cancer Maternal Aunt        breast- late 35s  . Cancer Maternal Grandmother        breast-late 40s    Past Surgical History:  Procedure Laterality Date  . ADENOIDECTOMY    . ankle sugery    . TYMPANOSTOMY     1 set   Social History   Occupational History  . Not on file  Tobacco Use  . Smoking status: Never Smoker  . Smokeless tobacco: Never Used  Substance and Sexual Activity  . Alcohol use: Yes    Comment: occasional  . Drug use: No  . Sexual activity: Yes    Partners: Female, Female    Birth control/protection: Pill

## 2018-04-24 MED FILL — traZODone HCL 50 MG TABS: 50 | 90 days supply | Qty: 270 | Fill #2

## 2018-04-25 MED FILL — CYCLOBENZAPRINE 5 MG TABLET: 5 | 30 days supply | Qty: 30 | Fill #0

## 2018-05-23 MED FILL — MONTELUKAST SOD 10 MG TAB: 10 | 30 days supply | Qty: 30 | Fill #3

## 2018-05-23 MED FILL — DULERA 100 MCG/5 MCG INH: 100-5 | 30 days supply | Qty: 13 | Fill #2

## 2018-06-21 ENCOUNTER — Other Ambulatory Visit: Payer: Self-pay | Admitting: Allergy & Immunology

## 2018-06-21 MED FILL — MONTELUKAST SOD 10 MG TAB: 10 | 30 days supply | Qty: 30 | Fill #0

## 2018-06-21 NOTE — Telephone Encounter (Signed)
Courtesy refill  

## 2018-06-22 ENCOUNTER — Ambulatory Visit: Payer: No Typology Code available for payment source | Admitting: Allergy & Immunology

## 2018-06-28 ENCOUNTER — Ambulatory Visit (INDEPENDENT_AMBULATORY_CARE_PROVIDER_SITE_OTHER): Payer: No Typology Code available for payment source | Admitting: Allergy & Immunology

## 2018-06-28 ENCOUNTER — Encounter: Payer: Self-pay | Admitting: Allergy & Immunology

## 2018-06-28 VITALS — BP 110/70 | HR 99 | Resp 16

## 2018-06-28 DIAGNOSIS — J302 Other seasonal allergic rhinitis: Secondary | ICD-10-CM

## 2018-06-28 DIAGNOSIS — J3089 Other allergic rhinitis: Secondary | ICD-10-CM

## 2018-06-28 DIAGNOSIS — J4540 Moderate persistent asthma, uncomplicated: Secondary | ICD-10-CM | POA: Diagnosis not present

## 2018-06-28 MED ORDER — FEXOFENADINE HCL 180 MG PO TABS: 180.0000 mg | ORAL_TABLET | Freq: Every day | ORAL | 5 refills | Status: DC

## 2018-06-28 MED ORDER — ALBUTEROL SULFATE 108 (90 BASE) MCG/ACT IN AEPB: 2.0000 | INHALATION_SPRAY | RESPIRATORY_TRACT | 1 refills | Status: DC

## 2018-06-28 MED ORDER — FLUTICASONE FUROATE-VILANTEROL 100-25 MCG/INH IN AEPB: 1.0000 | INHALATION_SPRAY | Freq: Every day | RESPIRATORY_TRACT | 5 refills | Status: DC

## 2018-06-28 NOTE — Progress Notes (Signed)
FOLLOW UP  Date of Service/Encounter:  06/28/18   Assessment:   Moderate persistent asthma without complication   Seasonal and perennial allergic rhinitis  Plan/Recommendations:   1. Moderate persistent asthma, uncomplicated - Spirometry looks good today, but we will change you to [facility] to make your medication regimen a little easier.   - Daily controller medication(s): Breo 100/13mcg one puff once daily - Prior to physical activity: ProAir 2 puffs 10-15 minutes before physical activity. - Rescue medications: ProAir 4 puffs every 4-6 hours as needed - Asthma control goals:  * Full participation in all desired activities (may need albuterol before activity) * Albuterol use two time or less a week on average (not counting use with activity) * Cough interfering with sleep two time or less a month * Oral steroids no more than once a year * No hospitalizations  2. Perennial allergic rhinitis - Continue with the nasal steroid two sprays per nostril as needed. - Continue with Allegra 180mg  once daily (you can try getting off of this since we are adding on the Singulair, which provide some coverage of allergies) - We will send in Bactroban to apply twice daily for one week to the inside of your nose.  3. Return in about 6 months (around 12/29/2018).  Subjective:   Katherine Wise is a 27 y.o. female presenting today for follow up of  Chief Complaint  Patient presents with  . Follow-up    Asthma    Katherine Wise has a history of the following: Patient Active Problem List   Diagnosis Date Noted  . Asthma 01/16/2016  . Allergic rhinitis 01/16/2016  . Attention deficit disorder (ADD) in adult 03/18/2015  . Dysmenorrhea 03/18/2015  . Generalized anxiety disorder 01/18/2014    History obtained from: chart review and patient.  Katherine Wise Primary Care Provider is Shelva Majestic, MD.     Katherine Wise is a 27 y.o. female presenting for a follow up visit. She was last seen in  January 2019. At that time, her spirometry looked good.  We discussed decreasing her controller medication at this visit if she was doing well.  Since the last visit, she has done well.  She has on [facility], but she only takes it once a day.  She decided to change the dosing at home since she was using a spacer now however, in retrospect she realizes that she was more short of breath with using it 2 puffs once a day.  She is an avid hiker and went to [employer] this past weekend.  She has never been on Breo.  ACT score is 22, indicating excellent asthma control.  Her rhinitis symptoms are well controlled with Zyrtec daily.  She alternates between antihistamines.  She does report a painful lesion in her right naris today, although I do not see anything on exam.  There is no bleeding.  Otherwise, there have been no changes to her past medical history, surgical history, family history, or social history.  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     Review of Systems: a 14-point review of systems is pertinent for what is mentioned in HPI.  Otherwise, all other systems were negative. Constitutional: negative other than that listed in the HPI Eyes: negative other than that listed in the HPI Ears, nose, mouth, throat, and face: negative other than that listed in the HPI Respiratory: negative other than that listed in  the HPI Cardiovascular: negative other than that listed in the HPI Gastrointestinal: negative other than that listed in the HPI Genitourinary: negative other than that listed in the HPI Integument: negative other than that listed in the HPI Hematologic: negative other than that listed in the HPI Musculoskeletal: negative other than that listed in the HPI Neurological: negative other than that listed in the HPI Allergy/Immunologic: negative other than that listed in the HPI    Objective:   Blood pressure 110/70, pulse 99, resp. rate 16, SpO2  99 %. There is no height or weight on file to calculate BMI.   Physical Exam:  General: Alert, interactive, in no acute distress. Pleasant female.  Eyes: No conjunctival injection bilaterally, no discharge on the right, no discharge on the left and no Horner-Trantas dots present. PERRL bilaterally. EOMI without pain. No photophobia.  Ears: Right TM pearly gray with normal light reflex, Left TM pearly gray with normal light reflex, Right TM intact without perforation and Left TM intact without perforation.  Nose/Throat: External nose within normal limits and septum midline. Turbinates edematous with clear discharge. Posterior oropharynx erythematous without cobblestoning in the posterior oropharynx. Tonsils 2+ without exudates.  Tongue without thrush. Lungs: Clear to auscultation without wheezing, rhonchi or rales. No increased work of breathing. CV: Normal S1/S2. No murmurs. Capillary refill <2 seconds.  Skin: Warm and dry, without lesions or rashes. Neuro:   Grossly intact. No focal deficits appreciated. Responsive to questions.  Diagnostic studies:   Spirometry: results normal (FEV1: 4.31/122%, FVC: 4.85/117%, FEV1/FVC: 89%).    Spirometry consistent with normal pattern.   Allergy Studies: none     Malachi BondsJoel Ladavion Savitz, MD  [facility]

## 2018-06-28 NOTE — Patient Instructions (Addendum)
1. Moderate persistent asthma, uncomplicated - Spirometry looks good today, but we [facility] to make your medication regimen a little easier.   - Daily controller medication(s): Breo 100/2225mcg one puff once daily - Prior to physical activity: ProAir 2 puffs 10-15 minutes before physical activity. - Rescue medications: ProAir 4 puffs every 4-6 hours as needed - Asthma control goals:  * Full participation in all desired activities (may need albuterol before activity) * Albuterol use two time or less a week on average (not counting use with activity) * Cough interfering with sleep two time or less a month * Oral steroids no more than once a year * No hospitalizations  2. Perennial allergic rhinitis - Continue with the nasal steroid two sprays per nostril as needed. - Continue with Allegra 180mg  once daily (you can try getting off of this since we are adding on the Singulair, which provide some coverage of allergies) - We will send in Bactroban to apply twice daily for one week to the inside of your nose.  3. Return in about 6 months (around 12/29/2018).   Please inform us of any Emergency Department visits, hospitalizations, or changes in symptoms. Call us before going to the ED for breathing or allergy symptoms since we might be able to fit you in for a sick visit. Feel free to contact us anytime with any questions, problems, or concerns.  It was a pleasure to see you again today!   Websites that have reliable patient information: 1. American Academy of Asthma, Allergy, and Immunology: www.aaaai.org 2. Food Allergy Research and Education (FARE): foodallergy.org 3. Mothers of Asthmatics: http://www.asthmacommunitynetwork.org 4. American College of Allergy, Asthma, and Immunology: www.acaai.org

## 2018-07-27 ENCOUNTER — Other Ambulatory Visit: Payer: Self-pay | Admitting: Allergy & Immunology

## 2018-07-27 MED FILL — BREO ELLIPTA 100-25 MCG INH: 100-25 | 30 days supply | Qty: 60 | Fill #0

## 2018-07-27 MED FILL — MONTELUKAST SOD 10 MG TAB: 10 | 90 days supply | Qty: 90 | Fill #0

## 2018-08-21 MED FILL — traZODone HCL 50 MG TABS: 50 | 90 days supply | Qty: 270 | Fill #0

## 2018-08-21 MED FILL — BREO ELLIPTA 100-25 MCG INH: 100-25 | 30 days supply | Qty: 60 | Fill #1

## 2018-09-08 MED FILL — ADDERALL XR 20 MG CAP SA: 20 | 90 days supply | Qty: 180 | Fill #0

## 2018-09-20 ENCOUNTER — Other Ambulatory Visit (HOSPITAL_COMMUNITY)
Admission: RE | Admit: 2018-09-20 | Discharge: 2018-09-20 | Disposition: A | Discharge status: Home / Self Care | Payer: No Typology Code available for payment source | Source: Ambulatory Visit | Attending: Family Medicine | Admitting: Family Medicine

## 2018-09-20 ENCOUNTER — Encounter: Payer: Self-pay | Admitting: Family Medicine

## 2018-09-20 ENCOUNTER — Ambulatory Visit (INDEPENDENT_AMBULATORY_CARE_PROVIDER_SITE_OTHER): Payer: No Typology Code available for payment source | Admitting: Family Medicine

## 2018-09-20 VITALS — BP 110/72 | HR 92 | Temp 98.6°F | Ht 67.0 in | Wt 147.4 lb

## 2018-09-20 DIAGNOSIS — N898 Other specified noninflammatory disorders of vagina: Secondary | ICD-10-CM

## 2018-09-20 DIAGNOSIS — B373 Candidiasis of vulva and vagina: Secondary | ICD-10-CM | POA: Insufficient documentation

## 2018-09-20 MED ORDER — CLOTRIMAZOLE-BETAMETHASONE 1-0.05 % EX CREA: 1.0000 "application " | TOPICAL_CREAM | Freq: Two times a day (BID) | CUTANEOUS | 0 refills | Status: DC

## 2018-09-20 MED FILL — CLOTRIMAZOLE-BETAMETHASONE: 1-0.05 | 10 days supply | Qty: 30 | Fill #0

## 2018-09-20 NOTE — Patient Instructions (Signed)
This looks more like external irritation.  Let us treat with Lotrisone which will cover both fungus and inflammation.  Also has an anti-itch component to it.  Use this twice a day for 7 to 10 days.  Please see [place] or Dr. Dareen Piano back if you fail to improve.  We will also check gonorrhea, chlamydia, trichomonas, yeast, bacterial vaginosis from your vaginal discharge- though I suspect these are going to be negative.  Will treat if needed

## 2018-09-20 NOTE — Progress Notes (Signed)
Subjective:  Katherine Wise is a 27 y.o. year old very pleasant female patient who presents for/with See problem oriented charting ROS-no fever or chills.  No dysuria or polyuria.  Does have vaginal itching  Past Medical History-  Patient Active Problem List   Diagnosis Date Noted  . Asthma 01/16/2016    Priority: Medium  . Attention deficit disorder (ADD) in adult 03/18/2015    Priority: Medium  . Dysmenorrhea 03/18/2015    Priority: Medium  . Generalized anxiety disorder 01/18/2014    Priority: Medium  . Allergic rhinitis 01/16/2016    Priority: Low    Medications- reviewed and updated Current Outpatient Medications  Medication Sig Dispense Refill  . albuterol (PROVENTIL HFA;VENTOLIN HFA) 108 (90 Base) MCG/ACT inhaler Use 2 puffs every four hours as needed for cough or wheeze.  May use 2 puffs 10-20 minutes prior to exercise. 1 Inhaler 1  . Albuterol Sulfate (PROAIR RESPICLICK) 108 (90 Base) MCG/ACT AEPB Inhale 2 puffs into the lungs every 4 (four) hours. 1 each 1  . amphetamine-dextroamphetamine (ADDERALL XR) 20 MG 24 hr capsule Take 20 mg by mouth daily.    . cyclobenzaprine (FLEXERIL) 5 MG tablet Take 1 tablet (5 mg total) by mouth daily as needed for muscle spasms. 30 tablet 3  . etonogestrel (NEXPLANON) 68 MG IMPL implant 68 mg by Subdermal route once.    . fexofenadine (ALLEGRA) 180 MG tablet Take 1 tablet (180 mg total) by mouth daily. 30 tablet 5  . fluticasone furoate-vilanterol (BREO ELLIPTA) 100-25 MCG/INH AEPB Inhale 1 puff into the lungs daily. 60 each 5  . mometasone-formoterol (DULERA) 100-5 MCG/ACT AERO Inhale 2 puffs into the lungs 2 (two) times daily. 1 Inhaler 5  . montelukast (SINGULAIR) 10 MG tablet TAKE 1 TABLET BY MOUTH AT BEDTIME. 90 tablet 1  . Multiple Vitamin (MULTIVITAMIN) tablet Take 1 tablet by mouth daily.    . traZODone (DESYREL) 50 MG tablet     . clotrimazole-betamethasone (LOTRISONE) cream Apply 1 application topically 2 (two) times daily. For  7-10 days 30 g 0   No current facility-administered medications for this visit.     Objective: BP 110/72   Pulse 92   Temp 98.6 F (37 C) (Oral)   Ht 5\' 7"  (1.702 m)   Wt 147 lb 6.4 oz (66.9 kg)   LMP 08/08/2018   SpO2 99%   BMI 23.09 kg/m  Gen: NAD, resting comfortably CV: RRR  Lungs: nonlabored, normal respiratory rate Abdomen: soft/nondistended Ext: no edema Skin: warm, dry GU: light amount of vaginal discharge- mix of clear and white. Externally between labia majora and minora- marked erythema- some small amounts of white- bilaterally. She does keep vaginal hair trimmed down and mild erythema over labia majora  Assessment/Plan:  Vaginal Itching S: uncomfortable intermittent vaginal itching. Started 3 weeks ago and itching was rather persistent at that time- not as persistent now. Tried femiclear for yeast infection and that may have helped some. Had slight discharge at first and now doesn't have vaginal itching. She is back to her baseline vaginal discharge- light amount of clear material.   Has nexplanon in place. Had one day when used vibrator more than she normally would. No burning with urination. No frequent urination.  Not staying well hydrated. No new detergents- uses fragrance free. Uses ph specific soap for vaginal area- no new soaps. She does want to cut down on caffeine.   Same partner over last 2 years- has unprotected sex.   Patient with  Nexplanon in place for birth control. A/P: 27 year old with vaginal itching- external- could be candidal intertrigo between external genitalia. From avs "This looks more like external irritation.  Let us treat with Lotrisone which will cover both fungus and inflammation.  Also has an anti-itch component to it.  Use this twice a day for 7 to 10 days.  Please see [place] or Dr. Dareen Piano back if you fail to improve.  We will also check gonorrhea, chlamydia, trichomonas, yeast, bacterial vaginosis from your vaginal discharge- though I  suspect these are going to be negative.  Will treat if needed"  Lab/Order associations: Vaginal itching - Plan: Cervicovaginal ancillary only( [place])  Meds ordered this encounter  Medications  . clotrimazole-betamethasone (LOTRISONE) cream    Sig: Apply 1 application topically 2 (two) times daily. For 7-10 days    Dispense:  30 g    Refill:  0   Return precautions advised.  Tana Conch, MD

## 2018-09-21 LAB — CERVICOVAGINAL ANCILLARY ONLY
Bacterial vaginitis: NEGATIVE
CHLAMYDIA, DNA PROBE: NEGATIVE
Candida vaginitis: POSITIVE — AB
NEISSERIA GONORRHEA: NEGATIVE
Trichomonas: NEGATIVE

## 2018-09-21 MED ORDER — FLUCONAZOLE 150 MG PO TABS: 150.0000 mg | ORAL_TABLET | Freq: Every day | ORAL | 0 refills | Status: DC

## 2018-09-21 MED FILL — BREO ELLIPTA 100-25 MCG INH: 100-25 | 30 days supply | Qty: 60 | Fill #2

## 2018-09-21 NOTE — Addendum Note (Signed)
Addended by: Shelva Majestic on: 09/21/2018 08:54 PM   Modules accepted: Orders

## 2018-09-22 MED FILL — FLUCONAZOLE 150 MG TABS: 150 | 1 days supply | Qty: 1 | Fill #0

## 2018-10-30 MED FILL — BREO ELLIPTA 100-25 MCG INH: 100-25 | 30 days supply | Qty: 60 | Fill #3

## 2018-11-22 DIAGNOSIS — R87619 Unspecified abnormal cytological findings in specimens from cervix uteri: Secondary | ICD-10-CM

## 2018-11-22 HISTORY — DX: Unspecified abnormal cytological findings in specimens from cervix uteri: R87.619

## 2018-12-04 MED FILL — BREO ELLIPTA 100-25 MCG INH: 100-25 | 30 days supply | Qty: 60 | Fill #4

## 2018-12-05 MED FILL — MONTELUKAST SOD 10 MG TAB: 10 | 90 days supply | Qty: 90 | Fill #1

## 2019-01-03 MED FILL — BREO ELLIPTA 100-25 MCG INH: 100-25 | 30 days supply | Qty: 60 | Fill #5

## 2019-01-16 MED FILL — traZODone HCL 50 MG TABS: 50 | 90 days supply | Qty: 270 | Fill #0

## 2019-02-07 ENCOUNTER — Other Ambulatory Visit: Payer: Self-pay | Admitting: Allergy & Immunology

## 2019-02-07 MED FILL — BREO ELLIPTA 100-25 MCG INH: 100-25 | 30 days supply | Qty: 60 | Fill #0

## 2019-02-07 NOTE — Telephone Encounter (Signed)
Pt called and needs to have her breo refilled . [facility] [phone].

## 2019-03-05 ENCOUNTER — Other Ambulatory Visit: Payer: Self-pay | Admitting: Allergy & Immunology

## 2019-03-05 MED FILL — MONTELUKAST SOD 10 MG TAB: 10 | 90 days supply | Qty: 90 | Fill #0

## 2019-03-10 ENCOUNTER — Other Ambulatory Visit: Payer: Self-pay | Admitting: Allergy & Immunology

## 2019-03-12 ENCOUNTER — Other Ambulatory Visit: Payer: Self-pay | Admitting: Allergy & Immunology

## 2019-03-12 MED FILL — BREO ELLIPTA 100-25 MCG INH: 100-25 | 30 days supply | Qty: 60 | Fill #0

## 2019-03-14 ENCOUNTER — Other Ambulatory Visit: Payer: Self-pay

## 2019-03-14 ENCOUNTER — Encounter: Payer: Self-pay | Admitting: Family Medicine

## 2019-03-14 DIAGNOSIS — J309 Allergic rhinitis, unspecified: Secondary | ICD-10-CM

## 2019-03-14 NOTE — Telephone Encounter (Signed)
Referral done

## 2019-03-19 MED FILL — ADDERALL XR 20 MG CAP SA: 20 | 90 days supply | Qty: 180 | Fill #0

## 2019-03-19 MED FILL — AMPHETAMINE-DEXTROAMPHETAMI: 20 | 90 days supply | Qty: 90 | Fill #0

## 2019-03-29 ENCOUNTER — Ambulatory Visit (INDEPENDENT_AMBULATORY_CARE_PROVIDER_SITE_OTHER): Payer: No Typology Code available for payment source | Admitting: Allergy & Immunology

## 2019-03-29 ENCOUNTER — Encounter: Payer: Self-pay | Admitting: Allergy & Immunology

## 2019-03-29 ENCOUNTER — Other Ambulatory Visit: Payer: Self-pay

## 2019-03-29 DIAGNOSIS — J3089 Other allergic rhinitis: Secondary | ICD-10-CM

## 2019-03-29 DIAGNOSIS — J302 Other seasonal allergic rhinitis: Secondary | ICD-10-CM

## 2019-03-29 DIAGNOSIS — J4540 Moderate persistent asthma, uncomplicated: Secondary | ICD-10-CM | POA: Diagnosis not present

## 2019-03-29 MED ORDER — ALBUTEROL SULFATE HFA 108 (90 BASE) MCG/ACT IN AERS: INHALATION_SPRAY | RESPIRATORY_TRACT | 2 refills | Status: DC

## 2019-03-29 MED ORDER — FLUTICASONE PROPIONATE 50 MCG/ACT NA SUSP: 1.0000 | Freq: Two times a day (BID) | NASAL | 5 refills | Status: DC

## 2019-03-29 MED ORDER — MONTELUKAST SODIUM 10 MG PO TABS: 10.0000 mg | ORAL_TABLET | Freq: Every day | ORAL | 3 refills | Status: DC

## 2019-03-29 MED ORDER — FLUTICASONE FUROATE-VILANTEROL 100-25 MCG/INH IN AEPB: INHALATION_SPRAY | RESPIRATORY_TRACT | 5 refills | Status: DC

## 2019-03-29 MED ORDER — MUPIROCIN CALCIUM 2 % NA OINT: 1.0000 "application " | TOPICAL_OINTMENT | Freq: Two times a day (BID) | NASAL | 1 refills | Status: DC | PRN

## 2019-03-29 MED FILL — MUPIROCIN 2% OINTMENT: 2 | 7 days supply | Qty: 22 | Fill #0

## 2019-03-29 MED FILL — FLUTICASONE PROP 50 MCG SPR: 50 | 30 days supply | Qty: 16 | Fill #0

## 2019-03-29 MED FILL — BREO ELLIPTA 100-25 MCG INH: 100-25 | 30 days supply | Qty: 60 | Fill #0

## 2019-03-29 MED FILL — ALBUTEROL SULFATE HFA 108 (: 108 (90 BAS | 17 days supply | Qty: 18 | Fill #0

## 2019-03-29 NOTE — Patient Instructions (Addendum)
1. Moderate persistent asthma, uncomplicated - It seems that the the Virgel Bouquet is working very well to control your symptoms. - We are not going to make any medication changes at this time.  - Daily controller medication(s): Breo 100/78mcg one puff once daily + Singulair 10mg  daily - Prior to physical activity: ProAir 2 puffs 10-15 minutes before physical activity. - Rescue medications: ProAir 4 puffs every 4-6 hours as needed - Asthma control goals:  * Full participation in all desired activities (may need albuterol before activity) * Albuterol use two time or less a week on average (not counting use with activity) * Cough interfering with sleep two time or less a month * Oral steroids no more than once a year * No hospitalizations  2. Perennial allergic rhinitis - Continue with fluticasone 1-2 sprays per nostril up to twice daily during the worst times of the year.  - Continue with Allegra 180mg  once daily. - Bactroban prescription sent in to use as needed during periods of nose bleeds. - Try looking on Amazon for cheap medications.   3. Return in about 6 months (around 09/29/2019). This can be an in-person, a virtual Webex or a telephone follow up visit.   Please inform us of any Emergency Department visits, hospitalizations, or changes in symptoms. Call us before going to the ED for breathing or allergy symptoms since we might be able to fit you in for a sick visit. Feel free to contact us anytime with any questions, problems, or concerns.  It was a pleasure to talk to you today today!  Websites that have reliable patient information: 1. American Academy of Asthma, Allergy, and Immunology: www.aaaai.org 2. Food Allergy Research and Education (FARE): foodallergy.org 3. Mothers of Asthmatics: http://www.asthmacommunitynetwork.org 4. American College of Allergy, Asthma, and Immunology: www.acaai.org  "Like" [place] on Facebook and Instagram for our latest updates!      Make sure you are  registered to vote! If you have moved or changed any of your contact information, you will need to get this updated before voting!    Voter ID laws are NOT going into effect for the General Election in November 2020! DO NOT let this stop you from exercising your right to vote!

## 2019-03-29 NOTE — Progress Notes (Signed)
RE: Katherine Wise MRN: [id number] DOB: 03/31/1991 Date of Telemedicine Visit: 03/29/2019  Referring provider: Shelva MajesticHunter, Stephen O, MD Primary care provider: Shelva MajesticHunter, Stephen O, MD  Chief Complaint: Asthma   Telemedicine Follow Up Visit via Telephone: I connected with Katherine Wise for a follow up on 03/29/19 by telephone and verified that I am speaking with the correct person using two identifiers.   I discussed the limitations, risks, security and privacy concerns of performing an evaluation and management service by telephone and the availability of in person appointments. I also discussed with the patient that there may be a patient responsible charge related to this service. The patient expressed understanding and agreed to proceed.  Patient is at home accompanied by herself who provided/contributed to the history.  Provider is at the office.  Visit start time: 8:55 AM Visit end time: 9:21 AM Insurance consent/check in by: JC Medical consent and medical assistant/nurse: Logan  History of Present Illness:  She is a 28 y.o. female, who is being followed for persistent asthma as well as rhinitis. Her previous allergy office visit was in August 2019 with Dr. Dellis AnesGallagher.  She was last seen in August 2019.  Her spirometry looks good.  However, we wanted to make her regimen a little easier so we changed her to Breo 100/25 mcg 1 puff once daily.  For her rhinitis, we continued Allegra.  We also sent in Bactroban because she was having some irritation in her nose.  We continued her with her nasal steroid as needed.  Katherine Wise how are you think since we saw you  Since the last visit, she has done well. 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 Thankfully she has not taken care of too many COVID patients.   Asthma/Respiratory Symptom History: She has done well from a respiratory perspective. She has  [facility]. She is paying $15 a month for this. She has not needed no prednisone at all. She is using her rescue inhaler 1-2 times per [facility] mostly during the warmer times of the year. Exercise is a large trigger.   Allergic Rhinitis Symptom History: She remains on the Allegra one tablet. Symptoms are mostly the same. She is not using a nose spray at this time. She was on fluticasone but she does not have any on hand. She does take the Singulair at night. She is unsure whether there was an improvement. She is continuing to take it because she is not sure that it is hurting. She was given the Bactroban yesterday but she never actually got it. She would like to have it in case she has the nose bleeds again.   Otherwise, there have been no changes to her past medical history, surgical history, family history, or social history.  Assessment and Plan:  Katherine Wise is a 28 y.o. female with:  Moderate persistent asthma without complication   Seasonal and perennial allergic rhinitis   1. Moderate persistent asthma, uncomplicated - It seems that the the Virgel BouquetBreo is working very well to control your symptoms. - We are not going to make any medication changes at this time.  - Daily controller medication(s): Breo 100/925mcg one puff once daily + Singulair 10mg  daily - Prior to physical activity: ProAir 2 puffs 10-15 minutes before physical activity. - Rescue medications: ProAir 4 puffs every 4-6 hours as needed - Asthma  control goals:  * Full participation in all desired activities (may need albuterol before activity) * Albuterol use two time or less a week on average (not counting use with activity) * Cough interfering with sleep two time or less a month * Oral steroids no more than once a year * No hospitalizations  2. Perennial allergic rhinitis - Continue with fluticasone 1-2 sprays per nostril up to twice daily during the worst times of the year.  - Continue with Allegra 180mg  once daily. -  Bactroban prescription sent in to use as needed during periods of nose bleeds. - Try looking on Amazon for cheap medications.   3. Return in about 6 months (around 09/29/2019). This can be an in-person, a virtual Webex or a telephone follow up visit.   Diagnostics: None.  Medication List:  Current Outpatient Medications  Medication Sig Dispense Refill  . albuterol (PROVENTIL HFA;VENTOLIN HFA) 108 (90 Base) MCG/ACT inhaler Use 2 puffs every four hours as needed for cough or wheeze.  May use 2 puffs 10-20 minutes prior to exercise. 1 Inhaler 1  . ALPRAZolam (XANAX) 0.5 MG tablet alprazolam 0.5 mg tablet    . amphetamine-dextroamphetamine (ADDERALL XR) 20 MG 24 hr capsule Take 20 mg by mouth daily.    . cyclobenzaprine (FLEXERIL) 5 MG tablet Take 1 tablet (5 mg total) by mouth daily as needed for muscle spasms. 30 tablet 3  . etonogestrel (NEXPLANON) 68 MG IMPL implant 68 mg by Subdermal route once.    . fexofenadine (ALLEGRA) 180 MG tablet Take 1 tablet (180 mg total) by mouth daily. 30 tablet 5  . fluticasone furoate-vilanterol (BREO ELLIPTA) 100-25 MCG/INH AEPB 1 puff once daily to prevent coughing or wheezing 1 each 5  . montelukast (SINGULAIR) 10 MG tablet TAKE 1 TABLET BY MOUTH AT BEDTIME 90 tablet 1  . traZODone (DESYREL) 50 MG tablet     . mupirocin nasal ointment (BACTROBAN NASAL) 2 % Place 1 application into the nose 2 (two) times daily as needed. 10 g 1   No current facility-administered medications for this visit.    Allergies: No Known Allergies I reviewed her past medical history, social history, family history, and environmental history and no significant changes have been reported from previous visits.  Review of Systems  Constitutional: Negative for activity change and appetite change.  HENT: Negative for congestion, postnasal drip, rhinorrhea, sinus pressure and sore throat.   Eyes: Negative for pain, discharge, redness and itching.  Respiratory: Negative for shortness  of breath, wheezing and stridor.   Gastrointestinal: Negative for diarrhea, nausea and vomiting.  Musculoskeletal: Negative for arthralgias, joint swelling and myalgias.  Skin: Negative for rash.  Allergic/Immunologic: Negative for environmental allergies and food allergies.    Objective:  Physical exam not obtained as encounter was done via telephone.   Previous notes and tests were reviewed.  I discussed the assessment and treatment plan with the patient. The patient was provided an opportunity to ask questions and all were answered. The patient agreed with the plan and demonstrated an understanding of the instructions.   The patient was advised to call back or seek an in-person evaluation if the symptoms worsen or if the condition fails to improve as anticipated.  I provided 26 minutes of non-face-to-face time during this encounter.  It was my pleasure to participate in Deming Losh's care today. Please feel free to contact me with any questions or concerns.   Sincerely,  Alfonse Spruce, MD

## 2019-04-02 ENCOUNTER — Telehealth: Payer: Self-pay

## 2019-04-02 NOTE — Telephone Encounter (Signed)
If she develops shortness of breath or confusion or trouble breathing she should let us know immediately.  If she happens to have a pulse oximeter at home I think it would be reasonable to monitor her pulse oximetry as well-she can also try to order 1 of these.

## 2019-04-02 NOTE — Telephone Encounter (Signed)
Forwarding to Dr. Durene Cal as FYI  Copied from CRM [id number]. Topic: General - Inquiry >> Apr 02, 2019  9:55 AM Crist Infante wrote: Reason for CRM: pt states she has tested positive for the covid 19 and was informed to let her pcp know. Pt was called this morning.  Pt states all she ad was a runny nose, no other sx.

## 2019-04-02 NOTE — Telephone Encounter (Signed)
Does she want to do a video visit to discuss COVID-19?  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  Please tell her thank you from [place] for being a nurse in general but also specifically during this difficult time.  Please make sure she is self quarantining- have friends or family drop off items as needed at home.  Return to work will likely be guided by health at work but I would tell her to at least consider herself contagious for at least 2 weeks- if she become more symptomatic then we may have to extend this guideline  It would be reasonable to inform anyone she had contact with about the diagnosis- although work/health at work should handle potential work contacts

## 2019-04-03 NOTE — Telephone Encounter (Signed)
I called pt and left a detailed message

## 2019-04-05 NOTE — Telephone Encounter (Signed)
Spoke to pt and she denies SOB, confusion and trouble breathing. Pt was advised that we may called and check on her periodically and pt stated that would be fine. Pt claimed that she ordered a pulse oximeter. Pt articulated that she feels fine asymptomatic and stated she will call if anything changes.

## 2019-04-10 NOTE — Telephone Encounter (Signed)
Spoke with patient. Never developed symptoms! She has been cleared to go back to work after over a week off. She is going to call back to schedule a physical at some point. Thrilled she is doing so well!

## 2019-04-17 MED FILL — traZODone HCL 50 MG TABS: 50 | 90 days supply | Qty: 270 | Fill #1

## 2019-04-23 MED FILL — BREO ELLIPTA 100-25 MCG INH: 100-25 | 30 days supply | Qty: 60 | Fill #0

## 2019-05-04 ENCOUNTER — Encounter: Payer: Self-pay | Admitting: Family Medicine

## 2019-05-04 ENCOUNTER — Ambulatory Visit (INDEPENDENT_AMBULATORY_CARE_PROVIDER_SITE_OTHER): Payer: No Typology Code available for payment source | Admitting: Family Medicine

## 2019-05-04 ENCOUNTER — Other Ambulatory Visit: Payer: Self-pay

## 2019-05-04 VITALS — BP 100/70 | HR 96 | Temp 97.6°F | Ht 67.0 in | Wt 142.2 lb

## 2019-05-04 DIAGNOSIS — Z0000 Encounter for general adult medical examination without abnormal findings: Secondary | ICD-10-CM | POA: Diagnosis not present

## 2019-05-04 DIAGNOSIS — F411 Generalized anxiety disorder: Secondary | ICD-10-CM | POA: Diagnosis not present

## 2019-05-04 DIAGNOSIS — Z13220 Encounter for screening for lipoid disorders: Secondary | ICD-10-CM

## 2019-05-04 LAB — CBC
HCT: 39.1 % (ref 36.0–46.0)
Hemoglobin: 13 g/dL (ref 12.0–15.0)
MCHC: 33.2 g/dL (ref 30.0–36.0)
MCV: 92.2 fl (ref 78.0–100.0)
Platelets: 265 10*3/uL (ref 150.0–400.0)
RBC: 4.24 Mil/uL (ref 3.87–5.11)
RDW: 13.4 % (ref 11.5–15.5)
WBC: 7.5 10*3/uL (ref 4.0–10.5)

## 2019-05-04 LAB — LIPID PANEL
Cholesterol: 168 mg/dL (ref 0–200)
HDL: 65.8 mg/dL (ref >39.00)
LDL Cholesterol: 93 mg/dL (ref 0–99)
NonHDL: 102.17
Total CHOL/HDL Ratio: 3
Triglycerides: 48 mg/dL (ref 0.0–149.0)
VLDL: 9.6 mg/dL (ref 0.0–40.0)

## 2019-05-04 LAB — COMPREHENSIVE METABOLIC PANEL
ALT: 11 U/L (ref 0–35)
AST: 14 U/L (ref 0–37)
Albumin: 4.9 g/dL (ref 3.5–5.2)
Alkaline Phosphatase: 46 U/L (ref 39–117)
BUN: 16 mg/dL (ref 6–23)
CO2: 26 mEq/L (ref 19–32)
Calcium: 9.8 mg/dL (ref 8.4–10.5)
Chloride: 105 mEq/L (ref 96–112)
Creatinine, Ser: 0.89 mg/dL (ref 0.40–1.20)
GFR: 76.44 mL/min (ref >60.00)
Glucose, Bld: 87 mg/dL (ref 70–99)
Potassium: 4.4 mEq/L (ref 3.5–5.1)
Sodium: 140 mEq/L (ref 135–145)
Total Bilirubin: 0.7 mg/dL (ref 0.2–1.2)
Total Protein: 6.9 g/dL (ref 6.0–8.3)

## 2019-05-04 NOTE — Progress Notes (Signed)
Phone: [phone]   Subjective:  Patient presents today for their annual physical. Chief complaint-noted.   See problem oriented charting- ROS- full  review of systems was completed and negative including No fever, chills, cough, shortness of breath, body aches, sore throat, or loss of taste or smell  The following were reviewed and entered/updated in epic: Past Medical History:  Diagnosis Date  . ADHD (attention deficit hyperactivity disorder) 03/18/2015   Dr. Toy Care psychiatry. ? OCD taking Adderall XR 71m. May be changing due to anxiety   . Dysmenorrhea 03/18/2015   Birth control- Junel improved this.  As of 03/18/15 in homosexual relationship and not active with males-trialing off. Will see if has regular periods. Concerned about libido.    .Marland KitchenExercise-induced asthma   . Generalized anxiety disorder 01/18/2014   Dr. KToy Carepsychiatry. BDewitt Rotafor counseling. Xanax 0.552mBID prn.  Trazodone for sleep SSRI tried (lexapro) and felt "numb" and got speeding ticket and didn't care    Patient Active Problem List   Diagnosis Date Noted  . Asthma 01/16/2016    Priority: Medium  . Attention deficit disorder (ADD) in adult 03/18/2015    Priority: Medium  . Dysmenorrhea 03/18/2015    Priority: Medium  . Generalized anxiety disorder 01/18/2014    Priority: Medium  . Allergic rhinitis 01/16/2016    Priority: Low   Past Surgical History:  Procedure Laterality Date  . ADENOIDECTOMY    . ankle sugery    . TYMPANOSTOMY     1 set    Family History  Problem Relation Age of Onset  . ADD / ADHD Mother   . Allergic rhinitis Mother   . Asthma Mother   . Hypertension Father   . Cancer Maternal Aunt        breast- late 4036s. Cancer Maternal Grandmother        breast-late 40s    Medications- reviewed and updated Current Outpatient Medications  Medication Sig Dispense Refill  . albuterol (VENTOLIN HFA) 108 (90 Base) MCG/ACT inhaler Use 2 puffs every four hours as needed for cough  or wheeze.  May use 2 puffs 10-20 minutes prior to exercise. 1 Inhaler 2  . ALPRAZolam (XANAX) 0.5 MG tablet alprazolam 0.5 mg tablet    . amphetamine-dextroamphetamine (ADDERALL XR) 20 MG 24 hr capsule Take 20 mg by mouth daily.    . Ascorbic Acid (VITAMIN C PO) Take by mouth.    . COLLAGEN PO Take by mouth.    . cyclobenzaprine (FLEXERIL) 5 MG tablet Take 1 tablet (5 mg total) by mouth daily as needed for muscle spasms. 30 tablet 3  . etonogestrel (NEXPLANON) 68 MG IMPL implant 68 mg by Subdermal route once.    . fexofenadine (ALLEGRA) 180 MG tablet Take 1 tablet (180 mg total) by mouth daily. 30 tablet 5  . fluticasone (FLONASE) 50 MCG/ACT nasal spray Place 1-2 sprays into both nostrils 2 (two) times a day. During the worst times of the year. 16 g 5  . fluticasone furoate-vilanterol (BREO ELLIPTA) 100-25 MCG/INH AEPB 1 puff once daily to prevent coughing or wheezing 1 each 5  . montelukast (SINGULAIR) 10 MG tablet Take 1 tablet (10 mg total) by mouth at bedtime. 90 tablet 3  . Multiple Vitamins-Minerals (ZINC PO) Take by mouth.    . mupirocin nasal ointment (BACTROBAN NASAL) 2 % Place 1 application into the nose 2 (two) times daily as needed. 10 g 1  . traZODone (DESYREL) 50 MG tablet  No current facility-administered medications for this visit.     Allergies-reviewed and updated No Known Allergies  Social History   Social History Narrative   Engaged with plans towards end of 2020.        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       Hobbies: sewing class 2018, has a dog   Objective  Objective:  BP 100/70 (BP Location: Left Arm, Patient Position: Sitting, Cuff Size: Normal)   Pulse 96   Temp 97.6 F (36.4 C) (Oral)   Ht _0  (1.702 m)   Wt 142 lb 3.2 oz (64.5 kg)   LMP 04/03/2019 (Approximate)   SpO2 98%   BMI 22.27 kg/m   Gen: NAD, resting comfortably HEENT: Mucous membranes are moist. Oropharynx normal Neck: no thyromegaly CV: RRR no murmurs rubs or gallops Lungs: CTAB no crackles, wheeze, rhonchi Abdomen: soft/nontender/nondistended/normal bowel sounds. No rebound or guarding.  Ext: no edema Skin: warm, dry Neuro: grossly normal, moves all extremities, PERRLA   Assessment and Plan    28 y.o. female presenting for annual physical.  Health Maintenance counseling: 1. Anticipatory guidance: Patient counseled regarding regular dental exams - she is working on q6 months- delayed due to covid 19, eye exams - yearly as wears contacts,  avoiding smoking and second hand smoke , limiting alcohol to 1 beverage per day .   2. Risk factor reduction:  Advised patient of need for regular exercise and diet rich and fruits and vegetables to reduce risk of heart attack and stroke. Exercise- was doing well precovid- with covid 19 has been walking regularly. Diet-reasonably balanced.  Wt Readings from Last 3 Encounters:  05/04/19 142 lb 3.2 oz (64.5 kg)  09/20/18 147 lb 6.4 oz (66.9 kg)  03/17/18 140 lb 12.8 oz (63.9 kg)  3. Immunizations/screenings/ancillary studies- up to date Immunization History  Administered Date(s) Administered  . Hepatitis B, adult 10/11/2013, 11/09/2013, 04/16/2014  . Hpv 04/04/2006, 12/10/2008, 04/11/2009  . Influenza,inj,Quad PF,6+ Mos 09/25/2013  . Influenza-Unspecified 09/07/2015, 09/02/2017, 08/17/2018  . MMR 10/11/2013  . PPD Test 09/25/2013, 10/22/2013, 03/18/2015, 04/16/2015, 04/09/2016  . Tdap 09/27/2013  . Varicella 03/18/2015  4. Cervical cancer screening- she will be due later this year for pap smear- she is going to look at some options and send me a message and we can refer her- she needs this for focus plan 5. Breast cancer screening-  breast exam will be done with GYN and mammogram - consider age 60 or so given aunt and grandmother with breast cancer in late 8s 6. Colon cancer  screening - no family history, start at age 25 7. Skin cancer screening- no dermatologist. advised regular sunscreen use. Denies worrisome, changing, or new skin lesions.  8. Birth control/STD check- IUD in place. monogamous so declines std testing.  9. Osteoporosis screening at 54- will plan on this 10. never smoker  Status of chronic or acute concerns  Doing well with psychiatry visits with Dr. Toy Care- anxiety and ADD  Doing well with asthma and allergies with Dr. Ernst Bowler  Has flexeril on hand from Dr. Erlinda Hong if needed but not recently needed  Future Appointments  Date Time Provider [place]  10/02/2019  5:30 PM Valentina Shaggy, MD [facility] None   Return in about 1 year (around 05/03/2020)  for physical.  Lab/Order associations: not fasting     ICD-10-CM   1. Preventative health care  Z00.00 CBC    Comprehensive metabolic panel    Lipid panel  2. Generalized anxiety disorder  F41.1 CBC    Comprehensive metabolic panel  3. Screening for hyperlipidemia  Z13.220 Lipid panel   Return precautions advised.  Garret Reddish, MD

## 2019-05-04 NOTE — Patient Instructions (Addendum)
Please stop by lab before you go If you do not have mychart- we will call you about results within 5 business days of us receiving them.  If you have mychart- we will send your results within 3 business days of us receiving them.  If abnormal or we want to clarify a result, we will call or mychart you to make sure you receive the message.  If you have questions or concerns or don't hear within 5-7 days, please send us a message or call us.     Glad you are doing well. 

## 2019-05-16 MED FILL — MONTELUKAST SOD 10 MG TAB: 10 | 90 days supply | Qty: 90 | Fill #0

## 2019-06-06 MED FILL — BREO ELLIPTA 100-25 MCG INH: 100-25 | 30 days supply | Qty: 60 | Fill #1

## 2019-07-04 MED FILL — BREO ELLIPTA 100-25 MCG INH: 100-25 | 30 days supply | Qty: 60 | Fill #2

## 2019-08-01 MED FILL — traZODone HCL 50 MG TABS: 50 | 90 days supply | Qty: 270 | Fill #0

## 2019-08-02 MED FILL — BREO ELLIPTA 100-25 MCG INH: 100-25 | 30 days supply | Qty: 60 | Fill #3

## 2019-08-14 ENCOUNTER — Encounter: Payer: Self-pay | Admitting: Family Medicine

## 2019-08-14 DIAGNOSIS — N898 Other specified noninflammatory disorders of vagina: Secondary | ICD-10-CM

## 2019-08-14 DIAGNOSIS — Z0000 Encounter for general adult medical examination without abnormal findings: Secondary | ICD-10-CM

## 2019-08-23 ENCOUNTER — Encounter: Payer: Self-pay | Admitting: Family Medicine

## 2019-08-29 ENCOUNTER — Other Ambulatory Visit (HOSPITAL_COMMUNITY)
Admission: RE | Admit: 2019-08-29 | Discharge: 2019-08-29 | Disposition: A | Discharge status: Home / Self Care | Payer: No Typology Code available for payment source | Source: Ambulatory Visit | Attending: Nurse Practitioner | Admitting: Nurse Practitioner

## 2019-08-29 ENCOUNTER — Other Ambulatory Visit: Payer: Self-pay | Admitting: Nurse Practitioner

## 2019-08-29 DIAGNOSIS — Z124 Encounter for screening for malignant neoplasm of cervix: Secondary | ICD-10-CM | POA: Insufficient documentation

## 2019-08-29 MED FILL — NORETHIND-ETH ESTRAD 1-0.02: 1-20 | 42 days supply | Qty: 42 | Fill #0

## 2019-09-10 LAB — CYTOLOGY - PAP
Chlamydia: NEGATIVE
Comment: NEGATIVE
Comment: NORMAL
Neisseria Gonorrhea: NEGATIVE

## 2019-09-10 MED FILL — MONTELUKAST SOD 10 MG TAB: 10 | 90 days supply | Qty: 90 | Fill #1

## 2019-09-10 MED FILL — BREO ELLIPTA 100-25 MCG INH: 100-25 | 30 days supply | Qty: 60 | Fill #4

## 2019-09-26 ENCOUNTER — Other Ambulatory Visit: Payer: Self-pay | Admitting: Nurse Practitioner

## 2019-10-02 ENCOUNTER — Ambulatory Visit: Payer: Self-pay | Admitting: Allergy & Immunology

## 2019-10-11 ENCOUNTER — Encounter: Payer: Self-pay | Admitting: Allergy & Immunology

## 2019-10-11 ENCOUNTER — Ambulatory Visit (INDEPENDENT_AMBULATORY_CARE_PROVIDER_SITE_OTHER): Payer: No Typology Code available for payment source | Admitting: Allergy & Immunology

## 2019-10-11 DIAGNOSIS — J302 Other seasonal allergic rhinitis: Secondary | ICD-10-CM | POA: Diagnosis not present

## 2019-10-11 DIAGNOSIS — J4540 Moderate persistent asthma, uncomplicated: Secondary | ICD-10-CM | POA: Diagnosis not present

## 2019-10-11 DIAGNOSIS — J3089 Other allergic rhinitis: Secondary | ICD-10-CM | POA: Diagnosis not present

## 2019-10-11 MED ORDER — QVAR REDIHALER 80 MCG/ACT IN AERB: 2.0000 | INHALATION_SPRAY | Freq: Two times a day (BID) | RESPIRATORY_TRACT | 5 refills | Status: DC

## 2019-10-11 MED FILL — QVAR REDIHALER 80 MCG/ACT A: 80 | 30 days supply | Qty: 11 | Fill #0

## 2019-10-11 NOTE — Patient Instructions (Addendum)
1. Moderate persistent asthma, uncomplicated - It seems that the the Memory Dance is working very well to control your symptoms.  - However with the copay card no longer working, we are going to try to step down therapy to either Flovent or Qvar, depending on which is covered by your insurance.  - Daily controller medication(s): Qvar 71mcg Redihaler 2 puffs twice daily + Singulair 10mg  daily - Prior to physical activity: ProAir 2 puffs 10-15 minutes before physical activity. - Rescue medications: ProAir 4 puffs every 4-6 hours as needed - Asthma control goals:  * Full participation in all desired activities (may need albuterol before activity) * Albuterol use two time or less a week on average (not counting use with activity) * Cough interfering with sleep two time or less a month * Oral steroids no more than once a year * No hospitalizations  2. Perennial allergic rhinitis - Continue with fluticasone 1-2 sprays per nostril up to twice daily during the worst times of the year.  - Continue with alternating antihistamines.  - Consider allergen immunotherapy if this method stops working in the future.   3. Return in about 6 months (around 04/09/2020). This can be an in-person, a virtual Webex or a telephone follow up visit.   Please inform us of any Emergency Department visits, hospitalizations, or changes in symptoms. Call us before going to the ED for breathing or allergy symptoms since we might be able to fit you in for a sick visit. Feel free to contact us anytime with any questions, problems, or concerns.  It was a pleasure to talk to you today today!  Websites that have reliable patient information: 1. American Academy of Asthma, Allergy, and Immunology: www.aaaai.org 2. Food Allergy Research and Education (FARE): foodallergy.org 3. Mothers of Asthmatics: http://www.asthmacommunitynetwork.org 4. American College of Allergy, Asthma, and Immunology: www.acaai.org  "Like" [place] on Facebook and  Instagram for our latest updates!      Make sure you are registered to vote! If you have moved or changed any of your contact information, you will need to get this updated before voting!  In some cases, you MAY be able to register to vote online: [email or web address]

## 2019-10-11 NOTE — Progress Notes (Signed)
RE: Katherine Wise MRN: [id number] DOB: 19-Aug-1991 Date of Telemedicine Visit: 10/11/2019  Referring provider: Marin Olp, MD Primary care provider: Marin Olp, MD  Chief Complaint: Asthma   Telemedicine Follow Up Visit via Telephone: I connected with Katherine Wise for a follow up on 10/11/19 by telephone and verified that I am speaking with the correct person using two identifiers.   I discussed the limitations, risks, security and privacy concerns of performing an evaluation and management service by telephone and the availability of in person appointments. I also discussed with the patient that there may be a patient responsible charge related to this service. The patient expressed understanding and agreed to proceed.  Patient is at home.  Provider is at the office.  Visit start time: 7:45 AM Visit end time: 8:05 AM Insurance consent/check in by: Garlon Hatchet Medical consent and medical assistant/nurse: Garlon Hatchet  History of Present Illness:  She is a 28 y.o. female, who is being followed for persistent asthma as well as perennial and seasonal allergic rhinitis. Her previous allergy office visit was in May 2020 with myself. At that visit, her symptoms seem to be well controlled.  We continued her on Breo 100/25 mcg 1 puff once daily in combination with Singulair 10 mg daily.  For her allergic rhinitis, we continued with Flonase 1 to 2 sprays per nostril up to twice daily as well as Allegra 180 mg once daily.  Since last visit, she has done well.  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  She has very emotionally worn out from the coronavirus pandemic.  Asthma/Respiratory Symptom History: She remains on Breo 1 puff once daily.  She tells me that the co-pay has gone up to $70 because the co-pay card is no longer working.  Previously she was paying $15.  She is interested in changing medications to help decrease the cost of them.  She has not required any prednisone or  emergency room visits since her last visit with [place].  She has not been hospitalized.  She denies any nighttime symptoms including coughing or wheezing.  She uses her albuterol very rarely.  She does have a spacer for the albuterol.  Review of her care with shows that she has been on a combined inhaled steroid and long-acting bronchodilator since we first saw her in June 2017.  Allergic Rhinitis Symptom History: She is currently on Flonase which she takes at least once daily.  She also now has Zyrtec 10 mg daily.  She is using generics.  She alternates her antihistamines which seems to control her symptoms fairly well.  Her last testing was performed in June 2017.  It was positive to weeds, grass, indoor and outdoor molds, and dust mite.  She has never been on allergen immunotherapy.  Otherwise, there have been no changes to her past medical history, surgical history, family history, or social history.  Assessment and Plan:  Katherine Wise is a 28 y.o. female with:  Moderate persistent asthma without complication   Seasonal and perennial allergic rhinitis (grasses, weeds, indoor molds, outdoor molds, dust mite)   1. Moderate persistent asthma, uncomplicated - It seems that the the Memory Dance is working very well to control your symptoms.  - However with the copay card no longer working, we are going to try to step down therapy to either Flovent or Qvar, depending on which is covered by your insurance.  - Daily controller medication(s): Qvar 32mcg Redihaler 2 puffs twice daily + Singulair  10mg  daily - Prior to physical activity: ProAir 2 puffs 10-15 minutes before physical activity. - Rescue medications: ProAir 4 puffs every 4-6 hours as needed - Asthma control goals:  * Full participation in all desired activities (may need albuterol before activity) * Albuterol use two time or less a week on average (not counting use with activity) * Cough interfering with sleep two time or less a month * Oral steroids no  more than once a year * No hospitalizations  2. Perennial allergic rhinitis - Continue with fluticasone 1-2 sprays per nostril up to twice daily during the worst times of the year.  - Continue with alternating antihistamines.  - Consider allergen immunotherapy if this method stops working in the future.   3. Return in about 6 months (around 04/09/2020). This can be an in-person, a virtual Webex or a telephone follow up visit.   Diagnostics: None.  Medication List:  Current Outpatient Medications  Medication Sig Dispense Refill   albuterol (VENTOLIN HFA) 108 (90 Base) MCG/ACT inhaler Use 2 puffs every four hours as needed for cough or wheeze.  May use 2 puffs 10-20 minutes prior to exercise. 1 Inhaler 2   ALPRAZolam (XANAX) 0.5 MG tablet alprazolam 0.5 mg tablet     amphetamine-dextroamphetamine (ADDERALL XR) 20 MG 24 hr capsule Take 20 mg by mouth daily.     Ascorbic Acid (VITAMIN C PO) Take by mouth.     beclomethasone (QVAR REDIHALER) 80 MCG/ACT inhaler Inhale 2 puffs into the lungs 2 (two) times daily. 10.6 g 5   COLLAGEN PO Take by mouth.     cyclobenzaprine (FLEXERIL) 5 MG tablet Take 1 tablet (5 mg total) by mouth daily as needed for muscle spasms. 30 tablet 3   etonogestrel (NEXPLANON) 68 MG IMPL implant 68 mg by Subdermal route once.     fexofenadine (ALLEGRA) 180 MG tablet Take 1 tablet (180 mg total) by mouth daily. 30 tablet 5   fluticasone (FLONASE) 50 MCG/ACT nasal spray Place 1-2 sprays into both nostrils 2 (two) times a day. During the worst times of the year. 16 g 5   fluticasone furoate-vilanterol (BREO ELLIPTA) 100-25 MCG/INH AEPB 1 puff once daily to prevent coughing or wheezing 1 each 5   montelukast (SINGULAIR) 10 MG tablet Take 1 tablet (10 mg total) by mouth at bedtime. 90 tablet 3   Multiple Vitamins-Minerals (ZINC PO) Take by mouth.     mupirocin nasal ointment (BACTROBAN NASAL) 2 % Place 1 application into the nose 2 (two) times daily as needed. 10  g 1   traZODone (DESYREL) 50 MG tablet      No current facility-administered medications for this visit.    Allergies: No Known Allergies I reviewed her past medical history, social history, family history, and environmental history and no significant changes have been reported from previous visits.  Review of Systems  Constitutional: Negative for activity change, appetite change, chills, fatigue and fever.  HENT: Negative for congestion, postnasal drip, rhinorrhea, sinus pressure and sore throat.   Eyes: Negative for pain, discharge, redness and itching.  Respiratory: Negative for shortness of breath, wheezing and stridor.   Gastrointestinal: Negative for diarrhea, nausea and vomiting.  Endocrine: Negative for cold intolerance and heat intolerance.  Musculoskeletal: Negative for arthralgias, joint swelling and myalgias.  Skin: Negative for rash.  Allergic/Immunologic: Negative for environmental allergies and food allergies.    Objective:  Physical exam not obtained as encounter was done via telephone.   Previous notes and tests were  reviewed.  I discussed the assessment and treatment plan with the patient. The patient was provided an opportunity to ask questions and all were answered. The patient agreed with the plan and demonstrated an understanding of the instructions.   The patient was advised to call back or seek an in-person evaluation if the symptoms worsen or if the condition fails to improve as anticipated.  I provided 20 minutes of non-face-to-face time during this encounter.  It was my pleasure to participate in [place]'s care today. Please feel free to contact me with any questions or concerns.   Sincerely,  Alfonse SpruceJoel Louis Deandra Goering, MD

## 2019-10-26 MED FILL — ADDERALL XR 20 MG CAP SA: 20 | 90 days supply | Qty: 180 | Fill #0

## 2019-10-26 MED FILL — traZODone HCL 50 MG TABS: 50 | 90 days supply | Qty: 270 | Fill #0

## 2019-10-26 MED FILL — AMPHETAMINE-DEXTROAMPHETAMI: 20 | 90 days supply | Qty: 90 | Fill #0

## 2019-11-05 ENCOUNTER — Telehealth: Payer: Self-pay | Admitting: *Deleted

## 2019-11-05 ENCOUNTER — Other Ambulatory Visit: Payer: Self-pay

## 2019-11-05 MED ORDER — BREO ELLIPTA 100-25 MCG/INH IN AEPB: INHALATION_SPRAY | RESPIRATORY_TRACT | 5 refills | Status: DC

## 2019-11-05 MED FILL — BREO ELLIPTA 100-25 MCG INH: 100-25 | 30 days supply | Qty: 60 | Fill #0

## 2019-11-05 NOTE — Telephone Encounter (Signed)
Patient called and states Qvar 80 mcg is not working for her as well as Breo 100 mcg did.  Patient has not been sleeping well since she started Qvar and just feels it is not helping her as much.  Patient would like to change back to [facility] or try different.  Memory Dance does cost her $60 every month and Qvar is $40.  Patient is willing to pay the cost of Breo at this time since it does work unless you prefer her to try something different.  A Breo 100 mcg sample has been placed on hold for patient until reviewed by Dr. Ernst Bowler.  [facility] outpatient pharmacy.

## 2019-11-05 NOTE — Telephone Encounter (Signed)
I hate how much these inhalers are. Does the copay card for Breo no longer work either? Can you reach out to Arbie Cookey our [facility] Rep to check on the copay cards? [phone]   Thanks!   Salvatore Marvel, MD [facility] [place]

## 2019-11-05 NOTE — Telephone Encounter (Signed)
Spoke with patient. Patient made aware of change per Dr. Ernst Bowler. Patient verbalizes that the provided coupon was no longer able to be used. Patient will pick up a sample today.

## 2019-12-14 MED FILL — MONTELUKAST SOD 10 MG TAB: 10 | 90 days supply | Qty: 90 | Fill #2

## 2019-12-14 MED FILL — BREO ELLIPTA 100-25 MCG INH: 100-25 | 30 days supply | Qty: 60 | Fill #1

## 2020-01-18 MED FILL — BREO ELLIPTA 100-25 MCG INH: 100-25 | 30 days supply | Qty: 60 | Fill #2

## 2020-01-18 MED FILL — traZODone HCL 50 MG TABS: 50 | 90 days supply | Qty: 270 | Fill #1

## 2020-02-27 MED FILL — BREO ELLIPTA 100-25 MCG INH: 100-25 | 30 days supply | Qty: 60 | Fill #3

## 2020-03-20 MED FILL — MONTELUKAST SOD 10 MG TAB: 10 | 90 days supply | Qty: 90 | Fill #3

## 2020-03-21 ENCOUNTER — Encounter: Payer: Self-pay | Admitting: Family Medicine

## 2020-03-21 DIAGNOSIS — J309 Allergic rhinitis, unspecified: Secondary | ICD-10-CM

## 2020-03-21 NOTE — Telephone Encounter (Signed)
Ok to send referral. Has been pulled up

## 2020-04-10 ENCOUNTER — Encounter: Payer: Self-pay | Admitting: Allergy & Immunology

## 2020-04-10 ENCOUNTER — Ambulatory Visit (INDEPENDENT_AMBULATORY_CARE_PROVIDER_SITE_OTHER): Payer: No Typology Code available for payment source | Admitting: Allergy & Immunology

## 2020-04-10 ENCOUNTER — Other Ambulatory Visit: Payer: Self-pay

## 2020-04-10 ENCOUNTER — Other Ambulatory Visit: Payer: Self-pay | Admitting: Allergy & Immunology

## 2020-04-10 VITALS — BP 124/78 | HR 103 | Temp 98.1°F | Resp 16 | Ht 66.0 in | Wt 156.8 lb

## 2020-04-10 DIAGNOSIS — J3089 Other allergic rhinitis: Secondary | ICD-10-CM | POA: Diagnosis not present

## 2020-04-10 DIAGNOSIS — J4540 Moderate persistent asthma, uncomplicated: Secondary | ICD-10-CM

## 2020-04-10 DIAGNOSIS — J302 Other seasonal allergic rhinitis: Secondary | ICD-10-CM | POA: Diagnosis not present

## 2020-04-10 MED ORDER — TRELEGY ELLIPTA 100-62.5-25 MCG/INH IN AEPB: 1.0000 | INHALATION_SPRAY | Freq: Every day | RESPIRATORY_TRACT | 3 refills | Status: DC

## 2020-04-10 NOTE — Patient Instructions (Addendum)
1. Moderate persistent asthma, uncomplicated - Sample of Trelegy provided today. - Let us know if you like it and we can send it in (copay card provided).  - Lung testing looks excellent. - Daily controller medication(s): Trelegy 100/62.5/25 one puff once daily + Singulair 10mg  daily - Prior to physical activity: ProAir 2 puffs 10-15 minutes before physical activity. - Rescue medications: ProAir 4 puffs every 4-6 hours as needed - Asthma control goals:  * Full participation in all desired activities (may need albuterol before activity) * Albuterol use two time or less a week on average (not counting use with activity) * Cough interfering with sleep two time or less a month * Oral steroids no more than once a year * No hospitalizations  2. Perennial allergic rhinitis - Continue with fluticasone 1-2 sprays per nostril up to twice daily during the worst times of the year.  - Continue with alternating antihistamines.  - Consider allergen immunotherapy if this method stops working in the future.   3. Return in about 6 months (around 10/11/2020). This can be an in-person, a virtual Webex or a telephone follow up visit.   Please inform 10/13/2020 of any Emergency Department visits, hospitalizations, or changes in symptoms. Call us before going to the ED for breathing or allergy symptoms since we might be able to fit you in for a sick visit. Feel free to contact us anytime with any questions, problems, or concerns.  It was a pleasure to see you again today!  Websites that have reliable patient information: 1. American Academy of Asthma, Allergy, and Immunology: www.aaaai.org 2. Food Allergy Research and Education (FARE): foodallergy.org 3. Mothers of Asthmatics: http://www.asthmacommunitynetwork.org 4. American College of Allergy, Asthma, and Immunology: www.acaai.org   COVID-19 Vaccine Information can be found at: [place] For  questions related to vaccine distribution or appointments, please email [email or web address] or call [phone].     "Like" [phone] on Facebook and Instagram for our latest updates!       HAPPY SPRING!  Make sure you are registered to vote! If you have moved or changed any of your contact information, you will need to get this updated before voting!  In some cases, you MAY be able to register to vote online: [place]

## 2020-04-10 NOTE — Progress Notes (Signed)
FOLLOW UP  Date of Service/Encounter:  04/10/20   Assessment:   Moderate persistent asthma without complication   Seasonal and perennial allergic rhinitis (grasses, weeds, indoor molds, outdoor molds, dust mite)  Plan/Recommendations:   1. Moderate persistent asthma, uncomplicated - Sample of Trelegy provided today. - Let us know if you like it and we can send it in (copay card provided).  - Lung testing looks excellent. - Daily controller medication(s): Trelegy 100/62.5/25 one puff once daily + Singulair 10mg  daily - Prior to physical activity: ProAir 2 puffs 10-15 minutes before physical activity. - Rescue medications: ProAir 4 puffs every 4-6 hours as needed - Asthma control goals:  * Full participation in all desired activities (may need albuterol before activity) * Albuterol use two time or less a week on average (not counting use with activity) * Cough interfering with sleep two time or less a month * Oral steroids no more than once a year * No hospitalizations  2. Perennial allergic rhinitis - Continue with fluticasone 1-2 sprays per nostril up to twice daily during the worst times of the year.  - Continue with alternating antihistamines.  - Consider allergen immunotherapy if this method stops working in the future.   3. Return in about 6 months (around 10/11/2020). This can be an in-person, a virtual Webex or a telephone follow up visit.   Subjective:   Katherine Wise is a 29 y.o. female presenting today for follow up of  Chief Complaint  Patient presents with  . Follow-up  . Asthma    Katherine Wise has a history of the following: Patient Active Problem List   Diagnosis Date Noted  . Asthma 01/16/2016  . Allergic rhinitis 01/16/2016  . Attention deficit disorder (ADD) in adult 03/18/2015  . Dysmenorrhea 03/18/2015  . Generalized anxiety disorder 01/18/2014    History obtained from: chart review and patient.  Katherine Wise is a 29 y.o. female presenting  for a follow up visit.  She was last seen via telephone visit in November 2020.  At that time, the Breo seem to be working well for her.  However, the co-pay card is not working and she was paying quite a bit for it.  Therefore, we stepped down to Qvar 80 mcg 2 puffs twice daily in combination with Singulair.  We want to see her back to make sure this was working.  For her rhinitis, we continue with Flonase as well as alternating antihistamines.    Since the last visit, she has done very well.  Asthma/Respiratory Symptom History: She remains on Breo 1 puff once daily.  She does report that it is rather expensive and was interested in something else.  However, when given alternatives, she prefers the 1 puff once daily in lieu of of anything more often.  She does report some continued shortness of breath episodes.  She has not been using her rescue inhaler for this.  He does seem to get worse with physical activity, but it can sometimes occur when she is doing nothing in particular.  ACT score is 25, indicating excellent asthma control.  Allergic Rhinitis Symptom History: She remains on Flonase as well as alternating antihistamines.  She has not needed any antibiotics at all since last visit.  Otherwise, there have been no changes to her past medical history, surgical history, family history, or social history.  Work is going well.  She does report that there have been a lot of deaths recently, not always to COVID-19. 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    Review of Systems  Constitutional: Negative for chills, fever, malaise/fatigue and weight loss.  HENT: Negative.  Negative for congestion, ear discharge, ear pain and nosebleeds.   Eyes: Negative for pain, discharge and redness.  Respiratory: Positive for shortness of breath. Negative for cough, sputum production and wheezing.   Cardiovascular: Negative.  Negative for chest pain and palpitations.  Gastrointestinal: Negative for abdominal  pain, constipation, diarrhea, heartburn, nausea and vomiting.  Skin: Negative.  Negative for itching and rash.  Neurological: Negative for dizziness and headaches.  Endo/Heme/Allergies: Negative for environmental allergies. Does not bruise/bleed easily.       Objective:   Blood pressure 124/78, pulse (!) 103, temperature 98.1 F (36.7 C), temperature source Temporal, resp. rate 16, height 5\' 6"  (1.676 m), weight 156 lb 12.8 oz (71.1 kg), SpO2 99 %. Body mass index is 25.31 kg/m.   Physical Exam:  Physical Exam  Constitutional: She appears well-developed and well-nourished.  Pleasant female.  HENT:  Head: Normocephalic and atraumatic.  Right Ear: Tympanic membrane, external ear and ear canal normal.  Left Ear: Tympanic membrane, external ear and ear canal normal.  Nose: Mucosal edema and rhinorrhea present. No nasal deformity or septal deviation. No epistaxis. Right sinus exhibits no maxillary sinus tenderness and no frontal sinus tenderness. Left sinus exhibits no maxillary sinus tenderness and no frontal sinus tenderness.  Mouth/Throat: Uvula is midline and oropharynx is clear and moist. Mucous membranes are not pale and not dry.  Tonsils unremarkable.  Eyes: Pupils are equal, round, and reactive to light. Conjunctivae and EOM are normal. Right eye exhibits no chemosis and no discharge. Left eye exhibits no chemosis and no discharge. Right conjunctiva is not injected. Left conjunctiva is not injected.  Cardiovascular: Normal rate, regular rhythm and normal heart sounds.  Respiratory: Effort normal and breath sounds normal. No accessory muscle usage. No tachypnea. No respiratory distress. She has no wheezes. She has no rhonchi. She has no rales. She exhibits no tenderness.  Moving air well in all lung fields.  No increased work of breathing.  Lymphadenopathy:    She has no cervical adenopathy.  Neurological: She is alert.  Skin: No abrasion, no petechiae and no rash noted. Rash is  not papular, not vesicular and not urticarial. No erythema. No pallor.  No eczematous or urticarial lesions noted.  Psychiatric: She has a normal mood and affect.     Diagnostic studies:    Spirometry: results normal (FEV1: 4.19/122%, FVC: 5.18/128%, FEV1/FVC: 81%).    Spirometry consistent with normal pattern.     Allergy Studies: none        Salvatore Marvel, MD  [facility]

## 2020-06-16 ENCOUNTER — Other Ambulatory Visit: Payer: Self-pay | Admitting: Allergy & Immunology

## 2020-06-16 MED FILL — MONTELUKAST SOD 10 MG TAB: 10 | 90 days supply | Qty: 90 | Fill #0

## 2020-06-20 MED FILL — TRELEGY ELLIPTA 100-62.5-25: 100-62.5-25 | 30 days supply | Qty: 60 | Fill #1

## 2020-06-23 ENCOUNTER — Ambulatory Visit (INDEPENDENT_AMBULATORY_CARE_PROVIDER_SITE_OTHER): Payer: No Typology Code available for payment source | Admitting: Family Medicine

## 2020-06-23 ENCOUNTER — Encounter: Payer: Self-pay | Admitting: Family Medicine

## 2020-06-23 ENCOUNTER — Other Ambulatory Visit: Payer: Self-pay

## 2020-06-23 VITALS — BP 118/78 | HR 100 | Temp 98.6°F | Ht 66.0 in | Wt 151.0 lb

## 2020-06-23 DIAGNOSIS — Z13220 Encounter for screening for lipoid disorders: Secondary | ICD-10-CM

## 2020-06-23 DIAGNOSIS — Z0000 Encounter for general adult medical examination without abnormal findings: Secondary | ICD-10-CM

## 2020-06-23 DIAGNOSIS — R5383 Other fatigue: Secondary | ICD-10-CM

## 2020-06-23 DIAGNOSIS — Z1159 Encounter for screening for other viral diseases: Secondary | ICD-10-CM | POA: Diagnosis not present

## 2020-06-23 DIAGNOSIS — F411 Generalized anxiety disorder: Secondary | ICD-10-CM

## 2020-06-23 DIAGNOSIS — F988 Other specified behavioral and emotional disorders with onset usually occurring in childhood and adolescence: Secondary | ICD-10-CM

## 2020-06-23 NOTE — Progress Notes (Signed)
Phone [phone]   Subjective:  Patient presents today for their annual physical. Chief complaint-noted.   See problem oriented charting- Review of Systems  Constitutional: Negative for chills and fever.  HENT: Negative for hearing loss and tinnitus.   Eyes: Negative for blurred vision and double vision.  Respiratory: Negative for cough, shortness of breath and wheezing.   Cardiovascular: Negative for chest pain, palpitations and leg swelling.  Gastrointestinal: Negative for heartburn, nausea and vomiting.  Genitourinary: Negative for dysuria, frequency and urgency.  Musculoskeletal: Negative for back pain, joint pain and neck pain.  Skin: Negative for rash.  Neurological: Negative for dizziness, seizures, weakness and headaches.  Endo/Heme/Allergies: Does not bruise/bleed easily.  Psychiatric/Behavioral: Negative for memory loss and suicidal ideas. The patient does not have insomnia.     The following were reviewed and entered/updated in epic: Past Medical History:  Diagnosis Date  . Abnormal Pap smear of cervix 2020  . ADHD (attention deficit hyperactivity disorder) 03/18/2015   Dr. Toy Care psychiatry. ? OCD taking Adderall XR 15m. May be changing due to anxiety   . Dysmenorrhea 03/18/2015   Birth control- Junel improved this.  As of 03/18/15 in homosexual relationship and not active with males-trialing off. Will see if has regular periods. Concerned about libido.    .Marland KitchenExercise-induced asthma   . Generalized anxiety disorder 01/18/2014   Dr. KToy Carepsychiatry. BDewitt Rotafor counseling. Xanax 0.542mBID prn.  Trazodone for sleep SSRI tried (lexapro) and felt "numb" and got speeding ticket and didn't care    Patient Active Problem List   Diagnosis Date Noted  . Asthma 01/16/2016    Priority: Medium  . Attention deficit disorder (ADD) in adult 03/18/2015    Priority: Medium  . Dysmenorrhea 03/18/2015    Priority: Medium  . Generalized anxiety disorder 01/18/2014    Priority:  Medium  . Allergic rhinitis 01/16/2016    Priority: Low   Past Surgical History:  Procedure Laterality Date  . ADENOIDECTOMY    . ankle sugery    . TYMPANOSTOMY     1 set    Family History  Problem Relation Age of Onset  . ADD / ADHD Mother   . Allergic rhinitis Mother   . Asthma Mother   . Hypertension Father   . Cancer Maternal Aunt        breast- late 409s. Cancer Maternal Grandmother        breast-late 40s    Medications- reviewed and updated Current Outpatient Medications  Medication Sig Dispense Refill  . albuterol (VENTOLIN HFA) 108 (90 Base) MCG/ACT inhaler Use 2 puffs every four hours as needed for cough or wheeze.  May use 2 puffs 10-20 minutes prior to exercise. 1 Inhaler 2  . ALPRAZolam (XANAX) 0.5 MG tablet alprazolam 0.5 mg tablet    . amphetamine-dextroamphetamine (ADDERALL XR) 20 MG 24 hr capsule Take 20 mg by mouth daily.    . Ascorbic Acid (VITAMIN C PO) Take by mouth.    . beclomethasone (QVAR REDIHALER) 80 MCG/ACT inhaler Inhale 2 puffs into the lungs 2 (two) times daily. (Patient not taking: Reported on 04/10/2020) 10.6 g 5  . COLLAGEN PO Take by mouth.    . cyclobenzaprine (FLEXERIL) 5 MG tablet Take 1 tablet (5 mg total) by mouth daily as needed for muscle spasms. 30 tablet 3  . etonogestrel (NEXPLANON) 68 MG IMPL implant 68 mg by Subdermal route once.    . fexofenadine (ALLEGRA) 180 MG tablet Take 1 tablet (180 mg total)  by mouth daily. 30 tablet 5  . fluticasone (FLONASE) 50 MCG/ACT nasal spray Place 1-2 sprays into both nostrils 2 (two) times a day. During the worst times of the year. 16 g 5  . fluticasone furoate-vilanterol (BREO ELLIPTA) 100-25 MCG/INH AEPB 1 puff once daily to prevent coughing or wheezing 1 each 5  . Fluticasone-Umeclidin-Vilant (TRELEGY ELLIPTA) 100-62.5-25 MCG/INH AEPB Inhale 1 puff into the lungs daily. 60 each 3  . montelukast (SINGULAIR) 10 MG tablet TAKE 1 TABLET (10 MG TOTAL) BY MOUTH AT BEDTIME. 90 tablet 1  . Multiple  Vitamins-Minerals (ZINC PO) Take by mouth.    . mupirocin nasal ointment (BACTROBAN NASAL) 2 % Place 1 application into the nose 2 (two) times daily as needed. (Patient not taking: Reported on 04/10/2020) 10 g 1  . traZODone (DESYREL) 50 MG tablet      No current facility-administered medications for this visit.    Allergies-reviewed and updated No Known Allergies  Social History   Social History Narrative   [place]        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       Hobbies: sewing class 2018   Objective  Objective:  BP 118/78   Pulse 100   Temp 98.6 F (37 C) (Temporal)   Ht _0  (1.676 m)   Wt 151 lb (68.5 kg)   LMP 06/02/2020   SpO2 98%   BMI 24.37 kg/m  Gen: NAD, resting comfortably HEENT: Mucous membranes are moist. Oropharynx normal Neck: no thyromegaly CV: RRR no murmurs rubs or gallops Lungs: CTAB no crackles, wheeze, rhonchi Abdomen: soft/nontender/nondistended/normal bowel sounds. No rebound or guarding.  Ext: no edema Skin: warm, dry Neuro: grossly normal, moves all extremities, PERRLA   Assessment and Plan   29 y.o. female presenting for annual physical.  Health Maintenance counseling: 1. Anticipatory guidance: Patient counseled regarding regular dental exams q6 months, eye exams yearly for contacts,  avoiding smoking and second hand smoke , limiting alcohol to 1 beverage per day- 2 times a week will have 2-3 drinks    Under 7 a week.  2. Risk factor reduction:  Advised patient of need for regular exercise and diet rich and fruits and vegetables to reduce risk of heart attack and stroke. 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 Diet-eats healthy diet. Tries to limit sweets.  Down 5 lbs form visit with Dr. Ernst Bowler a  few months ago Wt Readings from Last 3 Encounters:  06/23/20 151 lb (68.5 kg)  04/10/20 156 lb 12.8 oz (71.1 kg)  05/04/19 142 lb 3.2 oz (64.5 kg)  3. Immunizations/screenings/ancillary studies-had COVID-19 vaccinations already-he is going to some of the dates.  We discussed hepatitis C screening- she opts in.  Immunization History  Administered Date(s) Administered  . Hepatitis B, adult 10/11/2013, 11/09/2013, 04/16/2014  . Hpv 04/04/2006, 12/10/2008, 04/11/2009  . Influenza,inj,Quad PF,6+ Mos 09/25/2013  . Influenza-Unspecified 09/07/2015, 09/02/2017, 08/17/2018  . MMR 10/11/2013  . PPD Test 09/25/2013, 10/22/2013, 03/18/2015, 04/16/2015, 04/09/2016  . Tdap 09/27/2013  . Varicella 03/18/2015  4. Cervical cancer screening-  followed by GYN had abnormal pap last year will be repeated this year- already scheduled 5. Breast cancer screening-  breast exam does monthly self exam  and mammogram - maternal aunt and grandmother with breast caner late 71s- recommend yearly starting at 16  6. Colon cancer screening - no family history, start at age 48 7. Skin cancer screening- Not followed by dermatology.advised regular sunscreen use. Denies worrisome, changing, or new skin lesions.  No spots of concern.  8. Birth control/STD check- Nexplanon  In place- due for removal this year. monogomous so no std screening  9. Osteoporosis screening at 73- consider later date -never smoker  Status of chronic or acute concerns   # anxiety and ADD S:Foollowed by Dr. Toy Care . On adderall 65m XR, alprazolam as needed for anxiety A/P: reasonably well controlled- continue current meds  # asthma- followed by Dr. GErnst Bowler doing well on trelegy. Rescue inhaler sparingly if its hot.   #allergies- on allegra and flonase and singular   #some fatigue- checking labs to include tsh, vitamin d, b12. Did try some vitamin D supplements here and there Lab Results  Component Value Date   TSH 2.14 10/26/2017    Recommended follow up: Return in about 1 year (around 06/23/2021) for physical or sooner if needed. Future Appointments  Date Time Provider [place] 10/14/2020 10:15 AM GValentina Shaggy MD [facility] None   Lab/Order associations:not  fasting   ICD-10-CM   1. Preventative health care  Z00.00 Comprehensive metabolic panel    Lipid panel    Hepatitis C antibody    CBC with Differential/Platelet    CBC with Differential/Platelet    Comprehensive metabolic panel    Lipid panel    Hepatitis C antibody    CANCELED: CBC with Differential/Platelet    CANCELED: Hepatitis C antibody  2. Need for hepatitis C screening test  Z11.59 Hepatitis C antibody    Hepatitis C antibody    CANCELED: Hepatitis C antibody  3. Screening for hyperlipidemia  Z13.220 Lipid panel    Lipid panel  4. Other fatigue  R53.83 TSH    VITAMIN D 25 Hydroxy (Vit-D Deficiency, Fractures)    Vitamin B12    TSH    VITAMIN D 25 Hydroxy (Vit-D Deficiency, Fractures)    Vitamin B12  5. Generalized anxiety disorder  F41.1 TSH    VITAMIN D 25 Hydroxy (Vit-D Deficiency, Fractures)    Vitamin B12    TSH    VITAMIN D 25 Hydroxy (Vit-D Deficiency, Fractures)    Vitamin B12  6. Attention deficit disorder (ADD) in adult  F98.8     Return precautions advised.  SGarret Reddish MD

## 2020-06-23 NOTE — Patient Instructions (Addendum)
Health Maintenance Due  Topic Date Due  . Hepatitis C Screening will do today  Never done  . INFLUENZA VACCINE  We do not have yet - let us know when you get at work  06/22/2020    Please send my chart to let us know when you had the Covid Vaccinations.   If travel to [place]- would need hep a vaccinations at least 1 before you leave then repeat second in 6-12 months.   Thank you for doing labs If you have mychart- we will send your results within 3 business days of [place] receiving them.  If you do not have mychart- we will call you about results within 5 business days of [place] receiving them.  *please note we are currently using [facility] which has a longer processing time than [place] typically so labs may not come back as quickly as in the past *please also note that you will see labs on mychart as soon as they post. I will later go in and write notes on them- will say "notes from Dr. Durene Cal"  Recommended follow up: Return in about 1 year (around 06/23/2021) for physical or sooner if needed.

## 2020-06-24 LAB — COMPREHENSIVE METABOLIC PANEL
AG Ratio: 2 (calc) (ref 1.0–2.5)
ALT: 10 U/L (ref 6–29)
AST: 16 U/L (ref 10–30)
Albumin: 4.9 g/dL (ref 3.6–5.1)
Alkaline phosphatase (APISO): 50 U/L (ref 31–125)
BUN: 13 mg/dL (ref 7–25)
CO2: 24 mmol/L (ref 20–32)
Calcium: 10.1 mg/dL (ref 8.6–10.2)
Chloride: 102 mmol/L (ref 98–110)
Creat: 0.84 mg/dL (ref 0.50–1.10)
Globulin: 2.4 g/dL (calc) (ref 1.9–3.7)
Glucose, Bld: 88 mg/dL (ref 65–99)
Potassium: 4.2 mmol/L (ref 3.5–5.3)
Sodium: 137 mmol/L (ref 135–146)
Total Bilirubin: 0.9 mg/dL (ref 0.2–1.2)
Total Protein: 7.3 g/dL (ref 6.1–8.1)

## 2020-06-24 LAB — CBC WITH DIFFERENTIAL/PLATELET
Absolute Monocytes: 490 cells/uL (ref 200–950)
Basophils Absolute: 39 cells/uL (ref 0–200)
Basophils Relative: 0.4 %
Eosinophils Absolute: 20 cells/uL (ref 15–500)
Eosinophils Relative: 0.2 %
HCT: 43.7 % (ref 35.0–45.0)
Hemoglobin: 14.2 g/dL (ref 11.7–15.5)
Lymphs Abs: 1401 cells/uL (ref 850–3900)
MCH: 30.1 pg (ref 27.0–33.0)
MCHC: 32.5 g/dL (ref 32.0–36.0)
MCV: 92.6 fL (ref 80.0–100.0)
MPV: 10.6 fL (ref 7.5–12.5)
Monocytes Relative: 5 %
Neutro Abs: 7850 cells/uL — ABNORMAL HIGH (ref 1500–7800)
Neutrophils Relative %: 80.1 %
Platelets: 271 10*3/uL (ref 140–400)
RBC: 4.72 10*6/uL (ref 3.80–5.10)
RDW: 12 % (ref 11.0–15.0)
Total Lymphocyte: 14.3 %
WBC: 9.8 10*3/uL (ref 3.8–10.8)

## 2020-06-24 LAB — LIPID PANEL
Cholesterol: 181 mg/dL (ref <200)
HDL: 69 mg/dL (ref >=50)
LDL Cholesterol (Calc): 98 mg/dL (calc)
Non-HDL Cholesterol (Calc): 112 mg/dL (calc) (ref <130)
Total CHOL/HDL Ratio: 2.6 (calc) (ref <5.0)
Triglycerides: 54 mg/dL (ref <150)

## 2020-06-24 LAB — HEPATITIS C ANTIBODY
Hepatitis C Ab: NONREACTIVE
SIGNAL TO CUT-OFF: 0 (ref <1.00)

## 2020-06-24 LAB — VITAMIN D 25 HYDROXY (VIT D DEFICIENCY, FRACTURES): Vit D, 25-Hydroxy: 35 ng/mL (ref 30–100)

## 2020-06-24 LAB — VITAMIN B12: Vitamin B-12: 511 pg/mL (ref 200–1100)

## 2020-07-18 ENCOUNTER — Encounter: Payer: Self-pay | Admitting: Family Medicine

## 2020-07-18 NOTE — Telephone Encounter (Signed)
Pt tested positive for Covid-19 today Sx sinus pressure, cough, lost of taste, can smell things if placed closed to her nose  Please advise

## 2020-07-23 MED FILL — TRELEGY ELLIPTA 100-62.5-25: 100-62.5-25 | 30 days supply | Qty: 60 | Fill #2

## 2020-07-29 MED FILL — traZODone HCL 50 MG TABS: 50 | 90 days supply | Qty: 270 | Fill #3

## 2020-08-26 ENCOUNTER — Other Ambulatory Visit (HOSPITAL_COMMUNITY): Payer: Self-pay | Admitting: Specialist

## 2020-08-26 MED FILL — TRELEGY ELLIPTA 100-62.5-25: 100-62.5-25 | 30 days supply | Qty: 60 | Fill #3

## 2020-08-27 MED FILL — ADDERALL XR 20 MG CAP SA: 20 | 30 days supply | Qty: 60 | Fill #0

## 2020-09-12 LAB — RESULTS CONSOLE HPV: CHL HPV: POSITIVE

## 2020-09-16 MED FILL — MONTELUKAST SOD 10 MG TAB: 10 | 90 days supply | Qty: 90 | Fill #1

## 2020-09-22 DIAGNOSIS — B977 Papillomavirus as the cause of diseases classified elsewhere: Secondary | ICD-10-CM

## 2020-09-22 HISTORY — DX: Papillomavirus as the cause of diseases classified elsewhere: B97.7

## 2020-09-25 ENCOUNTER — Other Ambulatory Visit: Payer: Self-pay | Admitting: Allergy & Immunology

## 2020-09-25 MED FILL — TRELEGY ELLIPTA 100-62.5-25: 100-62.5-25 | 30 days supply | Qty: 60 | Fill #0

## 2020-10-14 ENCOUNTER — Ambulatory Visit: Payer: No Typology Code available for payment source | Admitting: Allergy & Immunology

## 2020-10-16 ENCOUNTER — Other Ambulatory Visit (HOSPITAL_COMMUNITY): Payer: Self-pay | Admitting: Nurse Practitioner

## 2020-10-17 ENCOUNTER — Encounter: Payer: Self-pay | Admitting: Family Medicine

## 2020-10-17 MED FILL — traMADol HCL 50 MG TABS: 50 | 4 days supply | Qty: 15 | Fill #0

## 2020-10-20 ENCOUNTER — Other Ambulatory Visit: Payer: Self-pay

## 2020-10-20 DIAGNOSIS — S62102B Fracture of unspecified carpal bone, left wrist, initial encounter for open fracture: Secondary | ICD-10-CM

## 2020-10-21 ENCOUNTER — Other Ambulatory Visit (HOSPITAL_COMMUNITY): Payer: Self-pay | Admitting: Specialist

## 2020-10-21 ENCOUNTER — Encounter: Payer: Self-pay | Admitting: Orthopaedic Surgery

## 2020-10-21 ENCOUNTER — Ambulatory Visit (INDEPENDENT_AMBULATORY_CARE_PROVIDER_SITE_OTHER): Payer: No Typology Code available for payment source | Admitting: Orthopaedic Surgery

## 2020-10-21 ENCOUNTER — Ambulatory Visit: Payer: No Typology Code available for payment source | Admitting: Allergy & Immunology

## 2020-10-21 ENCOUNTER — Ambulatory Visit (INDEPENDENT_AMBULATORY_CARE_PROVIDER_SITE_OTHER): Payer: No Typology Code available for payment source

## 2020-10-21 DIAGNOSIS — M25532 Pain in left wrist: Secondary | ICD-10-CM

## 2020-10-21 DIAGNOSIS — S52502A Unspecified fracture of the lower end of left radius, initial encounter for closed fracture: Secondary | ICD-10-CM

## 2020-10-21 MED FILL — AMPHETAMINE-DEXTROAMPHETAMI: 20 | 90 days supply | Qty: 90 | Fill #0

## 2020-10-21 MED FILL — traZODone HCL 50 MG TABS: 50 | 90 days supply | Qty: 270 | Fill #0

## 2020-10-21 MED FILL — ADDERALL XR 20 MG CAP SA: 20 | 90 days supply | Qty: 180 | Fill #0

## 2020-10-21 NOTE — Progress Notes (Signed)
Office Visit Note   Patient: Katherine Wise           Date of Birth: 17-Apr-1991           MRN: [id number] Visit Date: 10/21/2020              Requested by: Shelva Majestic, MD [address] [place],  [place] [address] PCP: Shelva Majestic, MD   Assessment & Plan: Visit Diagnoses:  1. Closed fracture of distal end of left radius, unspecified fracture morphology, initial encounter     Plan: Impression is distal radius fracture and a questionable nondisplaced ulnar styloid fracture.  She has a small irregularity on the dorsal cortex of the distal radius.  I reviewed the x-rays with her and significant other in detail.  This will be amenable to nonoperative treatment in a splint.  Work note provided today.  Recheck in 4 weeks with two-view x-rays of the left wrist.  Follow-Up Instructions: Return in about 4 weeks (around 11/18/2020).   Orders:  Orders Placed This Encounter  Procedures  . XR Wrist Complete Left   No orders of the defined types were placed in this encounter.     Procedures: No procedures performed   Clinical Data: No additional findings.   Subjective: Chief Complaint  Patient presents with  . Left Wrist - Pain    Katherine Wise is a 29 year old female who works as a Engineer, civil (consulting) on the trauma floor who comes in for evaluation of left wrist injury from a mechanical fall when she fell jumping over a light post last Wednesday.  She went to urgent care and x-rays were concerning for a dorsal distal radius fracture.  Currently taking Tylenol for the pain.  She was placed in a splint urgent care.   Review of Systems  Constitutional: Negative.   HENT: Negative.   Eyes: Negative.   Respiratory: Negative.   Cardiovascular: Negative.   Endocrine: Negative.   Musculoskeletal: Negative.   Neurological: Negative.   Hematological: Negative.   Psychiatric/Behavioral: Negative.   All other systems reviewed and are negative.    Objective: Vital Signs: There were no  vitals taken for this visit.  Physical Exam Vitals and nursing note reviewed.  Constitutional:      Appearance: She is well-developed.  HENT:     Head: Normocephalic and atraumatic.  Pulmonary:     Effort: Pulmonary effort is normal.  Abdominal:     Palpations: Abdomen is soft.  Musculoskeletal:     Cervical back: Neck supple.  Skin:    General: Skin is warm.     Capillary Refill: Capillary refill takes less than 2 seconds.  Neurological:     Mental Status: She is alert and oriented to person, place, and time.  Psychiatric:        Behavior: Behavior normal.        Thought Content: Thought content normal.        Judgment: Judgment normal.     Ortho Exam Left wrist shows mild swelling and tenderness over the dorsal aspect of the distal radius and radial styloid.  Anatomic snuffbox is nontender.  Ulnar styloid is minimally tender.  Specialty Comments:  No specialty comments available.  Imaging: XR Wrist Complete Left  Result Date: 10/21/2020 Irregularity of the dorsal cortex of the distal radius    PMFS History: Patient Active Problem List   Diagnosis Date Noted  . Asthma 01/16/2016  . Allergic rhinitis 01/16/2016  . Attention deficit disorder (ADD) in adult  03/18/2015  . Dysmenorrhea 03/18/2015  . Generalized anxiety disorder 01/18/2014   Past Medical History:  Diagnosis Date  . Abnormal Pap smear of cervix 2020  . ADHD (attention deficit hyperactivity disorder) 03/18/2015   Dr. Evelene Croon psychiatry. ? OCD taking Adderall XR 20mg . May be changing due to anxiety   . Dysmenorrhea 03/18/2015   Birth control- Junel improved this.  As of 03/18/15 in homosexual relationship and not active with males-trialing off. Will see if has regular periods. Concerned about libido.    03/20/15 Exercise-induced asthma   . Generalized anxiety disorder 01/18/2014   Dr. 01/20/2014 psychiatry. Evelene Croon for counseling. Xanax 0.5mg  BID prn.  Trazodone for sleep SSRI tried (lexapro) and felt "numb" and  got speeding ticket and didn't care     Family History  Problem Relation Age of Onset  . ADD / ADHD Mother   . Allergic rhinitis Mother   . Asthma Mother   . Hypertension Father   . Cancer Maternal Aunt        breast- late 50s  . Cancer Maternal Grandmother        breast-late 40s    Past Surgical History:  Procedure Laterality Date  . ADENOIDECTOMY    . ankle sugery    . TYMPANOSTOMY     1 set   Social History   Occupational History  . Not on file  Tobacco Use  . Smoking status: Never Smoker  . Smokeless tobacco: Never Used  Vaping Use  . Vaping Use: Never used  Substance and Sexual Activity  . Alcohol use: Yes    Comment: occasional  . Drug use: No  . Sexual activity: Yes    Partners: Female, Female    Birth control/protection: Pill

## 2020-11-06 MED FILL — TRELEGY ELLIPTA 100-62.5-25: 100-62.5-25 | 30 days supply | Qty: 60 | Fill #1

## 2020-11-13 ENCOUNTER — Encounter: Payer: Self-pay | Admitting: Allergy & Immunology

## 2020-11-13 ENCOUNTER — Other Ambulatory Visit: Payer: Self-pay

## 2020-11-13 ENCOUNTER — Ambulatory Visit (INDEPENDENT_AMBULATORY_CARE_PROVIDER_SITE_OTHER): Payer: No Typology Code available for payment source | Admitting: Allergy & Immunology

## 2020-11-13 VITALS — BP 110/64 | HR 93 | Temp 97.9°F | Resp 16 | Ht 66.0 in | Wt 149.8 lb

## 2020-11-13 DIAGNOSIS — J4540 Moderate persistent asthma, uncomplicated: Secondary | ICD-10-CM

## 2020-11-13 DIAGNOSIS — J302 Other seasonal allergic rhinitis: Secondary | ICD-10-CM

## 2020-11-13 DIAGNOSIS — J3089 Other allergic rhinitis: Secondary | ICD-10-CM | POA: Diagnosis not present

## 2020-11-13 NOTE — Progress Notes (Signed)
FOLLOW UP  Date of Service/Encounter:     Assessment:   Moderate persistent asthma without complication   Seasonal and perennial allergic rhinitis(grasses, weeds, indoor molds, outdoor molds, dust mite)   Fully vaccinated and boosted to [employer] worker  Left radius fracture  Plan/Recommendations:   1. Moderate persistent asthma, uncomplicated - Lung testing looks good today.  - We are not going to make any changes at all today. - Daily controller medication(s): Trelegy 100/62.5/25 one puff once daily + Singulair 10mg  daily - Prior to physical activity: ProAir 2 puffs 10-15 minutes before physical activity. - Rescue medications: ProAir 4 puffs every 4-6 hours as needed - Asthma control goals:  * Full participation in all desired activities (may need albuterol before activity) * Albuterol use two time or less a week on average (not counting use with activity) * Cough interfering with sleep two time or less a month * Oral steroids no more than once a year * No hospitalizations  2. Perennial allergic rhinitis - Continue with fluticasone 1-2 sprays per nostril up to twice daily during the worst times of the year.  - Continue with alternating antihistamines.  - Consider allergen immunotherapy if this method stops working in the future.   3. Return in about 6 months (around 05/14/2021).   Subjective:   Katherine Wise is a 29 y.o. female presenting today for follow up of  Chief Complaint  Patient presents with  . Asthma  . Allergic Rhinitis     Katherine Wise has a history of the following: Patient Active Problem List   Diagnosis Date Noted  . Moderate persistent asthma, uncomplicated 11/16/2020  . Seasonal and perennial allergic rhinitis 11/16/2020  . Asthma 01/16/2016  . Allergic rhinitis 01/16/2016  . Attention deficit disorder (ADD) in adult 03/18/2015  . Dysmenorrhea 03/18/2015  . Generalized anxiety disorder 01/18/2014    History obtained  from: chart review and patient.  Katherine Wise is a 29 y.o. female presenting for a follow up visit. She was last seen in May 2021. At that time, we gave her a sample of Trelegy and the coupon. We did this mostly because of her co-pay for [facility] is working fine, however, it was unaffordable. For her rhinitis, we continue with Flonase as well as alternating antihistamines. We did discuss allergen immunotherapy as a means of long-term control.  Since last visit, she has done well. She continues to work as an [employer] with [employer].   She got COVID at the beginning of the pandemic in March 2020. Then she contracted COVID in August 2021. She was not in the hospital but just felt terrible for several days. She felt very worn down with a low grade fever. She had muscle aches. She also experienced a lot of salivating for days straight. She was boosted in October or November.  Asthma/Respiratory Symptom History: She remains on the Trelegy one puff once daily. This is controlling her symptoms very well. The co-pay card is working in she appreciates that we made that change. Katherine Wise's asthma has been well controlled. She has not required rescue medication, experienced nocturnal awakenings due to lower respiratory symptoms, nor have activities of daily living been limited. She has required no Emergency Department or Urgent Care visits for her asthma. She has required zero courses of systemic steroids for asthma exacerbations since the last visit. ACT score today is 24, indicating excellent asthma symptom control.   Allergic Rhinitis Symptom History: She continues with Flonase at least  once daily. She does increase it to twice daily during particularly bad times a year. She has not had any antibiotics at all. Overall, she is feeling fairly good from an allergic rhinitis standpoint.  She did recently break her left radius in an accident after falling on the ground and breaking her fall with her arms.  Otherwise, there have  been no changes to her past medical history, surgical history, family history, or social history.    Review of Systems  Constitutional: Negative.  Negative for chills, fever, malaise/fatigue and weight loss.  HENT: Negative for congestion, ear discharge, ear pain and sinus pain.   Eyes: Negative for pain, discharge and redness.  Respiratory: Negative for cough, sputum production, shortness of breath and wheezing.   Cardiovascular: Negative.  Negative for chest pain and palpitations.  Gastrointestinal: Negative for abdominal pain, constipation, diarrhea, heartburn, nausea and vomiting.  Skin: Negative.  Negative for itching and rash.  Neurological: Negative for dizziness and headaches.  Endo/Heme/Allergies: Positive for environmental allergies. Does not bruise/bleed easily.       Objective:   Blood pressure 110/64, pulse 93, temperature 97.9 F (36.6 C), temperature source Temporal, resp. rate 16, height 5\' 6"  (1.676 m), weight 149 lb 12.8 oz (67.9 kg), SpO2 100 %. Body mass index is 24.18 kg/m.   Physical Exam:  Physical Exam Constitutional:      Appearance: She is well-developed.     Comments: Very pleasant female. Cooperative with the exam.  HENT:     Head: Normocephalic and atraumatic.     Right Ear: Tympanic membrane, ear canal and external ear normal.     Left Ear: Tympanic membrane and ear canal normal.     Nose: No nasal deformity, septal deviation, mucosal edema, rhinorrhea or epistaxis.     Right Sinus: No maxillary sinus tenderness or frontal sinus tenderness.     Left Sinus: No maxillary sinus tenderness or frontal sinus tenderness.     Mouth/Throat:     Mouth: Oropharynx is clear and moist. Mucous membranes are not pale and not dry.     Pharynx: Uvula midline.  Eyes:     General:        Right eye: No discharge.        Left eye: No discharge.     Extraocular Movements: EOM normal.     Conjunctiva/sclera: Conjunctivae normal.     Right eye: Right conjunctiva  is not injected. No chemosis.    Left eye: Left conjunctiva is not injected. No chemosis.    Pupils: Pupils are equal, round, and reactive to light.  Cardiovascular:     Rate and Rhythm: Normal rate and regular rhythm.     Heart sounds: Normal heart sounds.  Pulmonary:     Effort: Pulmonary effort is normal. No tachypnea, accessory muscle usage or respiratory distress.     Breath sounds: Normal breath sounds. No wheezing, rhonchi or rales.     Comments: Moving air well in all lung fields. No increased work of breathing. Chest:     Chest wall: No tenderness.  Lymphadenopathy:     Cervical: No cervical adenopathy.  Skin:    Coloration: Skin is not pale.     Findings: No abrasion, erythema, petechiae or rash. Rash is not papular, urticarial or vesicular.     Comments: No eczematous or urticarial lesions noted.  Neurological:     Mental Status: She is alert.  Psychiatric:        Mood and Affect: Mood and affect  normal.      Diagnostic studies:    Spirometry: results normal (FEV1: 4.39/128%, FVC: 4.93/122%, FEV1/FVC: 89%).    Spirometry consistent with normal pattern.   Allergy Studies: none       Malachi Bonds, MD  [facility]

## 2020-11-13 NOTE — Patient Instructions (Signed)
1. Moderate persistent asthma, uncomplicated - Lung testing looks good today.  - We are not going to make any changes at all today. - Daily controller medication(s): Trelegy 100/62.5/25 one puff once daily + Singulair 10mg  daily - Prior to physical activity: ProAir 2 puffs 10-15 minutes before physical activity. - Rescue medications: ProAir 4 puffs every 4-6 hours as needed - Asthma control goals:  * Full participation in all desired activities (may need albuterol before activity) * Albuterol use two time or less a week on average (not counting use with activity) * Cough interfering with sleep two time or less a month * Oral steroids no more than once a year * No hospitalizations  2. Perennial allergic rhinitis - Continue with fluticasone 1-2 sprays per nostril up to twice daily during the worst times of the year.  - Continue with alternating antihistamines.  - Consider allergen immunotherapy if this method stops working in the future.   3. Return in about 6 months (around 05/14/2021).    Please inform 05/16/2021 of any Emergency Department visits, hospitalizations, or changes in symptoms. Call us before going to the ED for breathing or allergy symptoms since we might be able to fit you in for a sick visit. Feel free to contact us anytime with any questions, problems, or concerns.  It was a pleasure to see you again today! Hope that wrist heals!   Websites that have reliable patient information: 1. American Academy of Asthma, Allergy, and Immunology: www.aaaai.org 2. Food Allergy Research and Education (FARE): foodallergy.org 3. Mothers of Asthmatics: http://www.asthmacommunitynetwork.org 4. American College of Allergy, Asthma, and Immunology: www.acaai.org   COVID-19 Vaccine Information can be found at: [place] For questions related to vaccine distribution or appointments, please email [email or web address] or call  [phone].     "Like" [phone] on Facebook and Instagram for our latest updates!       Make sure you are registered to vote! If you have moved or changed any of your contact information, you will need to get this updated before voting!  In some cases, you MAY be able to register to vote online: [place]

## 2020-11-16 DIAGNOSIS — J4540 Moderate persistent asthma, uncomplicated: Secondary | ICD-10-CM | POA: Insufficient documentation

## 2020-11-16 DIAGNOSIS — J302 Other seasonal allergic rhinitis: Secondary | ICD-10-CM | POA: Insufficient documentation

## 2020-11-16 DIAGNOSIS — J3089 Other allergic rhinitis: Secondary | ICD-10-CM | POA: Insufficient documentation

## 2020-11-19 ENCOUNTER — Encounter: Payer: Self-pay | Admitting: Orthopaedic Surgery

## 2020-11-19 ENCOUNTER — Ambulatory Visit (INDEPENDENT_AMBULATORY_CARE_PROVIDER_SITE_OTHER): Payer: No Typology Code available for payment source | Admitting: Orthopaedic Surgery

## 2020-11-19 ENCOUNTER — Other Ambulatory Visit: Payer: Self-pay

## 2020-11-19 ENCOUNTER — Ambulatory Visit: Payer: Self-pay

## 2020-11-19 DIAGNOSIS — S52502A Unspecified fracture of the lower end of left radius, initial encounter for closed fracture: Secondary | ICD-10-CM

## 2020-11-19 NOTE — Progress Notes (Signed)
Patient: Katherine Wise           Date of Birth: [birth year]           MRN: [id number] Visit Date: 11/19/2020 PCP: Shelva Majestic, MD   Assessment & Plan:  Chief Complaint:  Chief Complaint  Patient presents with   Left Wrist - Fracture, Follow-up   Visit Diagnoses:  1. Closed fracture of distal end of left radius, unspecified fracture morphology, initial encounter     Plan:   Katherine Wise follows up today for her nondisplaced distal radius fracture. Overall doing much better. Takes over-the-counter NSAID as needed. She has been working in the trauma unit with the wrist brace. She has been able to accommodate her wrist at work.  Physical exam shows resolved swelling. Some mild limitation to range of motion secondary to prolonged immobilization. No neurovascular compromise. No real tenderness. X-rays demonstrate consolidation of the fracture.  At this point we will have her wean the wrist brace and I do think that she would benefit from a short course of outpatient hand and wrist rehab. She will continue to work she has been. She would like to follow-up with [place] as needed rather than making a definite appointment for 4 weeks. I think that is reasonable since she has recovered quite well and that her fracture has consolidated.  Follow-Up Instructions: Return if symptoms worsen or fail to improve.   Orders:  Orders Placed This Encounter  Procedures   XR Wrist 2 Views Left   Ambulatory referral to Occupational Therapy   No orders of the defined types were placed in this encounter.   Imaging: XR Wrist 2 Views Left  Result Date: 11/19/2020 Significant healing of distal radius fracture. Fracture is completely consolidated   PMFS History: Patient Active Problem List   Diagnosis Date Noted   Moderate persistent asthma, uncomplicated 11/16/2020   Seasonal and perennial allergic rhinitis 11/16/2020   Asthma 01/16/2016   Allergic rhinitis 01/16/2016   Attention deficit  disorder (ADD) in adult 03/18/2015   Dysmenorrhea 03/18/2015   Generalized anxiety disorder 01/18/2014   Past Medical History:  Diagnosis Date   Abnormal Pap smear of cervix 2020   ADHD (attention deficit hyperactivity disorder) 03/18/2015   Dr. Evelene Croon psychiatry. ? OCD taking Adderall XR 20mg . May be changing due to anxiety    Dysmenorrhea 03/18/2015   Birth control- Junel improved this.  As of 03/18/15 in homosexual relationship and not active with males-trialing off. Will see if has regular periods. Concerned about libido.     Exercise-induced asthma    Generalized anxiety disorder 01/18/2014   Dr. 01/20/2014 psychiatry. Evelene Croon for counseling. Xanax 0.5mg  BID prn.  Trazodone for sleep SSRI tried (lexapro) and felt "numb" and got speeding ticket and didn't care     Family History  Problem Relation Age of Onset   ADD / ADHD Mother    Allergic rhinitis Mother    Asthma Mother    Hypertension Father    Cancer Maternal Aunt        breast- late 77s   Cancer Maternal Grandmother        breast-late 63s    Past Surgical History:  Procedure Laterality Date   ADENOIDECTOMY     ankle sugery     TYMPANOSTOMY     1 set   Social History   Occupational History   Not on file  Tobacco Use   Smoking status: Never Smoker   Smokeless tobacco: Never Used  Vaping  Use   Vaping Use: Never used  Substance and Sexual Activity   Alcohol use: Yes    Comment: occasional   Drug use: No   Sexual activity: Yes    Partners: Female, Female    Birth control/protection: Pill

## 2020-12-01 ENCOUNTER — Encounter: Payer: Self-pay | Admitting: Family Medicine

## 2020-12-02 ENCOUNTER — Other Ambulatory Visit: Payer: Self-pay

## 2020-12-02 DIAGNOSIS — M25532 Pain in left wrist: Secondary | ICD-10-CM

## 2020-12-02 NOTE — Progress Notes (Signed)
Referral for PT/OT has been placed. Patient has been made aware. Via [employer].

## 2020-12-03 NOTE — Telephone Encounter (Signed)
See below

## 2020-12-10 ENCOUNTER — Telehealth: Payer: Self-pay | Admitting: Allergy & Immunology

## 2020-12-10 MED FILL — TRELEGY ELLIPTA 100-62.5-25: 100-62.5-25 | 30 days supply | Qty: 60 | Fill #2

## 2020-12-10 NOTE — Telephone Encounter (Signed)
Patient would like another Trelegy co pay card if available. Patient's previous card just expired.  Please advise.

## 2020-12-11 NOTE — Telephone Encounter (Signed)
Patient called back and was advised to check on the Trelegy website to get the new copay card and savings. Patient verbalized understanding.

## 2020-12-11 NOTE — Telephone Encounter (Signed)
Called and left a voicemail asking for patient to return call. Unfortunately we do not have any Trelegy copay cards at this time. The patient can go online to the Trelegy website for coupon and saving assistance.

## 2020-12-19 ENCOUNTER — Other Ambulatory Visit: Payer: Self-pay | Admitting: Allergy & Immunology

## 2020-12-19 ENCOUNTER — Other Ambulatory Visit: Payer: Self-pay

## 2020-12-19 MED ORDER — MONTELUKAST SODIUM 10 MG PO TABS
10.0000 mg | ORAL_TABLET | Freq: Every day | ORAL | 0 refills | Status: DC
Start: 2020-12-19 — End: 2020-12-19

## 2020-12-19 MED FILL — MONTELUKAST SOD 10 MG TAB: 10 | 90 days supply | Qty: 90 | Fill #0

## 2021-01-08 ENCOUNTER — Ambulatory Visit: Payer: No Typology Code available for payment source | Admitting: Physical Therapy

## 2021-01-19 MED FILL — TRELEGY ELLIPTA 100-62.5-25: 100-62.5-25 | 30 days supply | Qty: 60 | Fill #3

## 2021-01-26 ENCOUNTER — Other Ambulatory Visit (HOSPITAL_COMMUNITY): Payer: Self-pay | Admitting: Specialist

## 2021-01-26 MED FILL — ALPRAZolam 0.5 MG TABS: 0.5 | 30 days supply | Qty: 60 | Fill #0

## 2021-01-26 MED FILL — traZODone HCL 50 MG TABS: 50 | 90 days supply | Qty: 270 | Fill #1

## 2021-02-09 ENCOUNTER — Other Ambulatory Visit (HOSPITAL_BASED_OUTPATIENT_CLINIC_OR_DEPARTMENT_OTHER): Payer: Self-pay

## 2021-02-13 ENCOUNTER — Other Ambulatory Visit (HOSPITAL_BASED_OUTPATIENT_CLINIC_OR_DEPARTMENT_OTHER): Payer: Self-pay

## 2021-02-24 ENCOUNTER — Other Ambulatory Visit (HOSPITAL_COMMUNITY): Payer: Self-pay

## 2021-02-24 MED FILL — Fluticasone-Umeclidinium-Vilanterol AEPB 100-62.5-25 MCG/ACT: RESPIRATORY_TRACT | 30 days supply | Qty: 60 | Fill #0 | Status: AC

## 2021-02-25 ENCOUNTER — Other Ambulatory Visit (HOSPITAL_COMMUNITY): Payer: Self-pay

## 2021-03-27 ENCOUNTER — Other Ambulatory Visit (HOSPITAL_COMMUNITY): Payer: Self-pay

## 2021-03-27 MED FILL — Fluticasone-Umeclidinium-Vilanterol AEPB 100-62.5-25 MCG/ACT: RESPIRATORY_TRACT | 30 days supply | Qty: 60 | Fill #1 | Status: AC

## 2021-03-31 ENCOUNTER — Other Ambulatory Visit (HOSPITAL_COMMUNITY): Payer: Self-pay

## 2021-03-31 MED FILL — Montelukast Sodium Tab 10 MG (Base Equiv): ORAL | 90 days supply | Qty: 90 | Fill #0 | Status: AC

## 2021-04-16 ENCOUNTER — Other Ambulatory Visit (HOSPITAL_COMMUNITY): Payer: Self-pay

## 2021-04-16 MED ORDER — AMPHETAMINE-DEXTROAMPHET ER 20 MG PO CP24
ORAL_CAPSULE | ORAL | 0 refills | Status: DC
  Filled 2021-04-16: qty 180, 90d supply, fill #0

## 2021-04-16 MED ORDER — AMPHETAMINE-DEXTROAMPHETAMINE 20 MG PO TABS
ORAL_TABLET | ORAL | 0 refills | Status: DC
  Filled 2021-04-16: qty 90, 90d supply, fill #0

## 2021-04-16 MED ORDER — TRAZODONE HCL 50 MG PO TABS
ORAL_TABLET | ORAL | 4 refills | Status: DC
  Filled 2021-04-16: qty 270, 90d supply, fill #0
  Filled 2021-08-06: qty 270, 90d supply, fill #1
  Filled 2021-11-06: qty 270, 90d supply, fill #2
  Filled 2022-02-11: qty 270, 90d supply, fill #3

## 2021-05-07 ENCOUNTER — Other Ambulatory Visit: Payer: Self-pay | Admitting: Allergy & Immunology

## 2021-05-07 ENCOUNTER — Other Ambulatory Visit (HOSPITAL_COMMUNITY): Payer: Self-pay

## 2021-05-07 MED ORDER — TRELEGY ELLIPTA 100-62.5-25 MCG/INH IN AEPB
1.0000 | INHALATION_SPRAY | Freq: Every day | RESPIRATORY_TRACT | 0 refills | Status: DC
  Filled 2021-05-07: qty 60, 30d supply, fill #0

## 2021-05-12 ENCOUNTER — Ambulatory Visit: Payer: No Typology Code available for payment source | Admitting: Allergy & Immunology

## 2021-05-12 ENCOUNTER — Encounter: Payer: Self-pay | Admitting: Allergy & Immunology

## 2021-05-12 ENCOUNTER — Other Ambulatory Visit: Payer: Self-pay

## 2021-05-12 ENCOUNTER — Other Ambulatory Visit (HOSPITAL_COMMUNITY): Payer: Self-pay

## 2021-05-12 VITALS — BP 132/78 | HR 98 | Temp 97.9°F | Resp 18 | Ht 67.0 in | Wt 148.4 lb

## 2021-05-12 DIAGNOSIS — R21 Rash and other nonspecific skin eruption: Secondary | ICD-10-CM

## 2021-05-12 DIAGNOSIS — J4540 Moderate persistent asthma, uncomplicated: Secondary | ICD-10-CM

## 2021-05-12 DIAGNOSIS — J3089 Other allergic rhinitis: Secondary | ICD-10-CM

## 2021-05-12 DIAGNOSIS — J302 Other seasonal allergic rhinitis: Secondary | ICD-10-CM

## 2021-05-12 MED ORDER — TRIAMCINOLONE ACETONIDE 0.1 % EX OINT
1.0000 "application " | TOPICAL_OINTMENT | Freq: Two times a day (BID) | CUTANEOUS | 5 refills | Status: DC
  Filled 2021-05-12: qty 30, 15d supply, fill #0

## 2021-05-12 NOTE — Progress Notes (Signed)
FOLLOW UP  Date of Service/Encounter:  05/12/21   Assessment:   Moderate persistent asthma without complication   Seasonal and perennial allergic rhinitis (grasses, weeds, indoor molds, outdoor molds, dust mite)    Fully vaccinated and boosted to [employer] worker   Left radius fracture - healed without surgery  Rash of hands - on extensor surface, but lasts only 1-3 hours     Plan/Recommendations:   1. Moderate persistent asthma, uncomplicated - Lung testing looks AMAZING! - We are not going to make any changes at all today. - Daily controller medication(s): Trelegy 100/62.5/25 one puff once daily + Singulair 10mg  daily - Prior to physical activity: ProAir 2 puffs 10-15 minutes before physical activity. - Rescue medications: ProAir 4 puffs every 4-6 hours as needed - Asthma control goals:  * Full participation in all desired activities (may need albuterol before activity) * Albuterol use two time or less a week on average (not counting use with activity) * Cough interfering with sleep two time or less a month * Oral steroids no more than once a year-congestionJanuary Daycare * No hospitalizations  2. Perennial allergic rhinitis - Continue with fluticasone 1-2 sprays per nostril up to twice daily during the worst times of the year.  - Continue with alternating antihistamines.  - Consider allergen immunotherapy if this method stops working in the future.   3. Rash - Keep journaling triggers. - Start triamcinolone 0.1% ointment twice daily as needed for the rash.   4. Follow up in 6 months.   Subjective:   Katherine Wise is a 30 y.o. female presenting today for follow up of  Chief Complaint  Patient presents with   Asthma    ACT -25     Katherine Wise has a history of the following: Patient Active Problem List   Diagnosis Date Noted   Moderate persistent asthma, uncomplicated 11/16/2020   Seasonal and perennial allergic rhinitis 11/16/2020    Asthma 01/16/2016   Allergic rhinitis 01/16/2016   Attention deficit disorder (ADD) in adult 03/18/2015   Dysmenorrhea 03/18/2015   Generalized anxiety disorder 01/18/2014    History obtained from: chart review and patient.  Katherine Wise is a 30 y.o. female presenting for a follow up visit.  She was last seen in December 2021.  At that time, her lung testing looked great.  We continued her on Trelegy 100 mcg 1 puff once daily as well as Singulair 10 mg daily.  For her rhinitis, would continue with Flonase as well as alternating antihistamines.  Since last visit, she has done well.  Asthma/Respiratory Symptom History: She remains on the Trelegy one puff once daily. Katherine Wise's asthma has been well controlled. She has not required rescue medication, experienced nocturnal awakenings due to lower respiratory symptoms, nor have activities of daily living been limited. She has required no Emergency Department or Urgent Care visits for her asthma. She has required zero courses of systemic steroids for asthma exacerbations since the last visit. ACT score today is 25, indicating excellent asthma symptom control. She is happy with how well she is doing.  Allergic Rhinitis Symptom History: She remains on Flonase.  She alternates her antihistamines.  She has not needed any antibiotics for any infections since last visit. She does report some epistaxis every morning when she blows her nose.  Eczema Symptom History: She has issues on the extensor surface of her hands. She does show me pictures that clearly show some redness on that part of the  hands. She cannot pinpoint it at all. It seems to be spontaneous. Rash sticks around for one hour, sometimes longer. She mostly ignores it.  This is predominantly in the index finger and she can tell that it gets weird. It is never raised. It does become "inflamed" and "hot". She has no history of arthritis with her personally.   Work is going well.  She continues to work at [employer].  She has taken a trip to the beach at the end of the summer with her family.  They typically go to [place].  Otherwise, there have been no changes to her past medical history, surgical history, family history, or social history.    Review of Systems  Constitutional: Negative.  Negative for chills, fever, malaise/fatigue and weight loss.  HENT:  Positive for congestion. Negative for ear discharge, ear pain and sinus pain.   Eyes:  Negative for pain, discharge and redness.  Respiratory:  Positive for cough. Negative for sputum production, shortness of breath and wheezing.   Cardiovascular: Negative.  Negative for chest pain and palpitations.  Gastrointestinal:  Negative for abdominal pain, constipation, diarrhea, heartburn, nausea and vomiting.  Skin: Negative.  Negative for itching and rash.  Neurological:  Negative for dizziness and headaches.  Endo/Heme/Allergies:  Positive for environmental allergies. Does not bruise/bleed easily.      Objective:   Blood pressure 132/78, pulse 98, temperature 97.9 F (36.6 C), resp. rate 18, height 5\' 7"  (1.702 m), weight 148 lb 6.4 oz (67.3 kg), SpO2 100 %. Body mass index is 23.24 kg/m.   Physical Exam:  Physical Exam Constitutional:      Appearance: She is well-developed.     Comments: Pleasant female.  HENT:     Head: Normocephalic and atraumatic.     Right Ear: Tympanic membrane, ear canal and external ear normal.     Left Ear: Tympanic membrane, ear canal and external ear normal.     Ears:     Comments: Scarring of the TMs bilaterally.    Nose: No nasal deformity, septal deviation, mucosal edema or rhinorrhea.     Right Turbinates: Enlarged and swollen.     Left Turbinates: Enlarged and swollen.     Right Sinus: No maxillary sinus tenderness or frontal sinus tenderness.     Left Sinus: No maxillary sinus tenderness or frontal sinus tenderness.     Mouth/Throat:     Lips: Pink.     Mouth: Mucous membranes are  moist. Mucous membranes are not pale and not dry.     Pharynx: Uvula midline.  Eyes:     General: Lids are normal. No allergic shiner.       Right eye: No discharge.        Left eye: No discharge.     Conjunctiva/sclera: Conjunctivae normal.     Right eye: Right conjunctiva is not injected. No chemosis.    Left eye: Left conjunctiva is not injected. No chemosis.    Pupils: Pupils are equal, round, and reactive to light.  Cardiovascular:     Rate and Rhythm: Normal rate and regular rhythm.     Heart sounds: Normal heart sounds.  Pulmonary:     Effort: Pulmonary effort is normal. No tachypnea, accessory muscle usage or respiratory distress.     Breath sounds: Normal breath sounds. No wheezing, rhonchi or rales.     Comments: Moving air well in all lung fields.  No increased work of breathing. Chest:  Chest wall: No tenderness.  Lymphadenopathy:     Cervical: No cervical adenopathy.  Skin:    Coloration: Skin is not pale.     Findings: No abrasion, erythema, petechiae or rash. Rash is not papular, urticarial or vesicular.  Neurological:     Mental Status: She is alert.  Psychiatric:        Behavior: Behavior is cooperative.     Diagnostic studies:    Spirometry: results normal (FEV1: 4.19/120%, FVC: 4.83/116%, FEV1/FVC: 87%).    Spirometry consistent with normal pattern.   Allergy Studies: none        Malachi Bonds, MD  [facility]

## 2021-05-12 NOTE — Patient Instructions (Signed)
1. Moderate persistent asthma, uncomplicated - Lung testing looks good today.  - We are not going to make any changes at all today. - Daily controller medication(s): Trelegy 100/62.5/25 one puff once daily + Singulair 10mg  daily - Prior to physical activity: ProAir 2 puffs 10-15 minutes before physical activity. - Rescue medications: ProAir 4 puffs every 4-6 hours as needed - Asthma control goals:  * Full participation in all desired activities (may need albuterol before activity) * Albuterol use two time or less a week on average (not counting use with activity) * Cough interfering with sleep two time or less a month * Oral steroids no more than once a year * No hospitalizations  2. Perennial allergic rhinitis - Continue with fluticasone 1-2 sprays per nostril up to twice daily during the worst times of the year.  - Continue with alternating antihistamines.  - Consider allergen immunotherapy if this method stops working in the future.   3. No follow-ups on file.    Please inform of any Emergency Department visits, hospitalizations, or changes in symptoms. Call us before going to the ED for breathing or allergy symptoms since we might be able to fit you in for a sick visit. Feel free to contact us anytime with any questions, problems, or concerns.  It was a pleasure to see you again today! Hope that wrist heals!   Websites that have reliable patient information: 1. American Academy of Asthma, Allergy, and Immunology: www.aaaai.org 2. Food Allergy Research and Education (FARE): foodallergy.org 3. Mothers of Asthmatics: http://www.asthmacommunitynetwork.org 4. American College of Allergy, Asthma, and Immunology: www.acaai.org   COVID-19 Vaccine Information can be found at: [place] For questions related to vaccine distribution or appointments, please email [email or web address] or call [phone].     "Like"  [phone] on Facebook and Instagram for our latest updates!       Make sure you are registered to vote! If you have moved or changed any of your contact information, you will need to get this updated before voting!  In some cases, you MAY be able to register to vote online: [place]

## 2021-06-16 ENCOUNTER — Other Ambulatory Visit (HOSPITAL_COMMUNITY): Payer: Self-pay

## 2021-06-16 ENCOUNTER — Other Ambulatory Visit: Payer: Self-pay | Admitting: Allergy & Immunology

## 2021-06-16 MED ORDER — TRELEGY ELLIPTA 100-62.5-25 MCG/INH IN AEPB
1.0000 | INHALATION_SPRAY | Freq: Every day | RESPIRATORY_TRACT | 4 refills | Status: DC
  Filled 2021-06-16: qty 60, 30d supply, fill #0
  Filled 2021-07-23: qty 60, 30d supply, fill #1
  Filled 2021-08-26: qty 60, 30d supply, fill #2

## 2021-07-03 ENCOUNTER — Other Ambulatory Visit (HOSPITAL_COMMUNITY): Payer: Self-pay

## 2021-07-03 MED FILL — Montelukast Sodium Tab 10 MG (Base Equiv): ORAL | 90 days supply | Qty: 90 | Fill #0 | Status: AC

## 2021-07-16 ENCOUNTER — Encounter: Payer: Self-pay | Admitting: Family Medicine

## 2021-07-16 ENCOUNTER — Ambulatory Visit (INDEPENDENT_AMBULATORY_CARE_PROVIDER_SITE_OTHER): Payer: No Typology Code available for payment source | Admitting: Family Medicine

## 2021-07-16 ENCOUNTER — Other Ambulatory Visit: Payer: Self-pay

## 2021-07-16 VITALS — BP 114/71 | HR 78 | Temp 98.6°F | Ht 67.0 in | Wt 144.4 lb

## 2021-07-16 DIAGNOSIS — F411 Generalized anxiety disorder: Secondary | ICD-10-CM

## 2021-07-16 DIAGNOSIS — E785 Hyperlipidemia, unspecified: Secondary | ICD-10-CM | POA: Diagnosis not present

## 2021-07-16 DIAGNOSIS — F988 Other specified behavioral and emotional disorders with onset usually occurring in childhood and adolescence: Secondary | ICD-10-CM | POA: Diagnosis not present

## 2021-07-16 DIAGNOSIS — Z0000 Encounter for general adult medical examination without abnormal findings: Secondary | ICD-10-CM

## 2021-07-16 DIAGNOSIS — J309 Allergic rhinitis, unspecified: Secondary | ICD-10-CM

## 2021-07-16 DIAGNOSIS — E559 Vitamin D deficiency, unspecified: Secondary | ICD-10-CM

## 2021-07-16 DIAGNOSIS — Z1283 Encounter for screening for malignant neoplasm of skin: Secondary | ICD-10-CM

## 2021-07-16 DIAGNOSIS — J4540 Moderate persistent asthma, uncomplicated: Secondary | ICD-10-CM

## 2021-07-16 LAB — CBC WITH DIFFERENTIAL/PLATELET
Basophils Absolute: 0 10*3/uL (ref 0.0–0.1)
Basophils Relative: 0.3 % (ref 0.0–3.0)
Eosinophils Absolute: 0.1 10*3/uL (ref 0.0–0.7)
Eosinophils Relative: 0.8 % (ref 0.0–5.0)
HCT: 42.4 % (ref 36.0–46.0)
Hemoglobin: 13.9 g/dL (ref 12.0–15.0)
Lymphocytes Relative: 19.4 % (ref 12.0–46.0)
Lymphs Abs: 1.4 10*3/uL (ref 0.7–4.0)
MCHC: 32.9 g/dL (ref 30.0–36.0)
MCV: 91.5 fl (ref 78.0–100.0)
Monocytes Absolute: 0.5 10*3/uL (ref 0.1–1.0)
Monocytes Relative: 6.3 % (ref 3.0–12.0)
Neutro Abs: 5.4 10*3/uL (ref 1.4–7.7)
Neutrophils Relative %: 73.2 % (ref 43.0–77.0)
Platelets: 241 10*3/uL (ref 150.0–400.0)
RBC: 4.63 Mil/uL (ref 3.87–5.11)
RDW: 13.5 % (ref 11.5–15.5)
WBC: 7.4 10*3/uL (ref 4.0–10.5)

## 2021-07-16 LAB — TSH: TSH: 0.9 u[IU]/mL (ref 0.35–5.50)

## 2021-07-16 LAB — LIPID PANEL
Cholesterol: 187 mg/dL (ref 0–200)
HDL: 82.5 mg/dL (ref >39.00)
LDL Cholesterol: 98 mg/dL (ref 0–99)
NonHDL: 104.22
Total CHOL/HDL Ratio: 2
Triglycerides: 30 mg/dL (ref 0.0–149.0)
VLDL: 6 mg/dL (ref 0.0–40.0)

## 2021-07-16 LAB — COMPREHENSIVE METABOLIC PANEL
ALT: 22 U/L (ref 0–35)
AST: 19 U/L (ref 0–37)
Albumin: 4.7 g/dL (ref 3.5–5.2)
Alkaline Phosphatase: 48 U/L (ref 39–117)
BUN: 18 mg/dL (ref 6–23)
CO2: 27 mEq/L (ref 19–32)
Calcium: 10.1 mg/dL (ref 8.4–10.5)
Chloride: 103 mEq/L (ref 96–112)
Creatinine, Ser: 0.8 mg/dL (ref 0.40–1.20)
GFR: 100.16 mL/min (ref >60.00)
Glucose, Bld: 73 mg/dL (ref 70–99)
Potassium: 4.5 mEq/L (ref 3.5–5.1)
Sodium: 139 mEq/L (ref 135–145)
Total Bilirubin: 0.9 mg/dL (ref 0.2–1.2)
Total Protein: 7.4 g/dL (ref 6.0–8.3)

## 2021-07-16 LAB — VITAMIN D 25 HYDROXY (VIT D DEFICIENCY, FRACTURES): VITD: 47.12 ng/mL (ref 30.00–100.00)

## 2021-07-16 NOTE — Telephone Encounter (Signed)
HPV and immunization records updated on pt chart

## 2021-07-16 NOTE — Progress Notes (Signed)
Phone [phone]   Subjective:  Patient presents today for their annual physical. Chief complaint-noted.   See problem oriented charting- ROS- full  review of systems was completed and negative except for: some wrist pain ulnar side on left wrist  The following were reviewed and entered/updated in epic: Past Medical History:  Diagnosis Date   Abnormal Pap smear of cervix 2020   ADHD (attention deficit hyperactivity disorder) 03/18/2015   Dr. Toy Care psychiatry. ? OCD taking Adderall XR 71m. May be changing due to anxiety    Dysmenorrhea 03/18/2015   Birth control- Junel improved this.  As of 03/18/15 in homosexual relationship and not active with males-trialing off. Will see if has regular periods. Concerned about libido.     Exercise-induced asthma    Generalized anxiety disorder 01/18/2014   Dr. [facility]iatry. [place] counseling. Xanax 0.540mBID prn.  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   Patient Active Problem List   Diagnosis Date Noted   Asthma 01/16/2016    Priority: Medium   Attention deficit disorder (ADD) in adult 03/18/2015    Priority: Medium   Dysmenorrhea 03/18/2015    Priority: Medium   Generalized anxiety disorder 01/18/2014    Priority: Medium   Allergic rhinitis 01/16/2016    Priority: Low   Moderate persistent asthma, uncomplicated 1282/42/3536 Seasonal and perennial allergic rhinitis 11/16/2020   Past Surgical History:  Procedure Laterality Date   ADENOIDECTOMY     ankle sugery     TYMPANOSTOMY     1 set    Family History  Problem Relation Age of Onset   ADD / ADHD Mother    Allergic rhinitis Mother    Asthma Mother    Hypertension Father    Cancer Maternal Aunt        breast- late 404s Cancer Maternal Grandmother        breast-late 40s    Medications- reviewed and updated Current Outpatient Medications  Medication Sig Dispense Refill    albuterol (VENTOLIN HFA) 108 (90 Base) MCG/ACT inhaler Use 2 puffs every four hours as needed for cough or wheeze.  May use 2 puffs 10-20 minutes prior to exercise. 1 Inhaler 2   ALPRAZolam (XANAX) 0.5 MG tablet TAKE 1 TABLET BY MOUTH 2 TIMES DAILY AS NEEDED 60 tablet 0   amphetamine-dextroamphetamine (ADDERALL XR) 20 MG 24 hr capsule Take 2 capsules by mouth daily. 180 capsule 0   amphetamine-dextroamphetamine (ADDERALL) 20 MG tablet Take 1 tablet by mouth once daily 90 tablet 0   Ascorbic Acid (VITAMIN C PO) Take by mouth.     COLLAGEN PO Take by mouth.     cyclobenzaprine (FLEXERIL) 5 MG tablet Take 1 tablet (5 mg total) by mouth daily as needed for muscle spasms. 30 tablet 3   fexofenadine (ALLEGRA) 180 MG tablet Take 1 tablet (180 mg total) by mouth daily. 30 tablet 5   fluticasone (FLONASE) 50 MCG/ACT nasal spray Place 1-2 sprays into both nostrils 2 (two) times a day. During the worst times of the year. 16 g 5   Fluticasone-Umeclidin-Vilant (TRELEGY ELLIPTA) 100-62.5-25 MCG/INH AEPB INHALE 1 PUFF INTO THE LUNGS DAILY. 60 each 4   montelukast (SINGULAIR) 10 MG tablet TAKE 1 TABLET BY MOUTH EVERY NIGHT AT BEDTIME 90 tablet 1   Multiple Vitamins-Minerals (ZINC PO) Take by mouth.     traZODone (DESYREL) 50 MG tablet Take  1-3 tablets by mouth at bedtime 270 tablet 4   triamcinolone ointment (KENALOG) 0.1 % Apply 1 application topically 2 (two) times daily. 30 g 5   No current facility-administered medications for this visit.    Allergies-reviewed and updated No Known Allergies  Social History   Social History Narrative   Mar[place] august 2020 on the beach at [place].        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       Hobbies: sewing class 2018   Objective  Objective:  BP 114/71   Pulse 78   Temp 98.6 F (37 C) (Temporal)   Ht 5' 7"  (1.702 m)   Wt 144 lb 6.4 oz (65.5 kg)   LMP 07/10/2021   SpO2 98%   BMI 22.62 kg/m  Gen: NAD, resting comfortably HEENT: Mucous membranes are moist. Oropharynx normal Neck: no thyromegaly CV: RRR no murmurs rubs or gallops Lungs: CTAB no crackles, wheeze, rhonchi Abdomen: soft/nontender/nondistended/normal bowel sounds. No rebound or guarding.  Ext: no edema Skin: warm, dry Neuro: grossly normal, moves all extremities, PERRLA   Assessment and Plan   30 y.o. female presenting for annual physical.  Health Maintenance counseling: 1. Anticipatory guidance: Patient counseled regarding regular dental exams -q6 months, eye exams -yearly for contacts,  avoiding smoking and second hand smoke , limiting alcohol to 1 beverage per day -under 7 a week .   2. Risk factor reduction:  Advised patient of need for regular exercise and diet rich and fruits and vegetables to reduce risk of heart attack and stroke. 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 Diet-eats healthy and tries to limit sweets. BMI ideal Wt Readings from Last 3 Encounters:  07/16/21 144 lb 6.4 oz (65.5 kg)  05/12/21 148 lb 6.4 oz (67.3 kg)  11/13/20 149 lb 12.8 oz (67.9 kg)  3. Immunizations/screenings/ancillary studies- discuss 3rd COVID-19 booster-she will send [place] the date- and Flu vaccination-will get in fall and send [place] the dates - otherwise up-to-date. Immunization History  Administered Date(s) Administered   Hepatitis B, adult 10/11/2013, 11/09/2013, 04/16/2014   Hpv-Unspecified 04/04/2006, 12/10/2008, 04/11/2009   Influenza,inj,Quad PF,6+ Mos 09/25/2013   Influenza-Unspecified 09/07/2015, 09/02/2017, 08/17/2018   MMR 10/11/2013   PFIZER(Purple Top)SARS-COV-2 Vaccination 11/14/2019, 12/05/2019   PPD Test 09/25/2013, 10/22/2013, 03/18/2015, 04/16/2015, 04/09/2016   Tdap 09/27/2013   Varicella 03/18/2015   4. Cervical cancer screening- 08/29/2019 on file. History of abnormal pap  with HPV- on yearly schedule at this point. She may send [place] a picture 5. Breast cancer screening-  breast exam -does monthly self exams  and GYn checks as well-and mammogram maternal aunt and grandmother with breast caner late 40s- recommend yearly starting at 44  6. Colon cancer screening - no family history, start at age 65  7. Skin cancer screening- Not followed by dermatology- but agrees to one time check. advised regular sunscreen use. Denies worrisome, changing, or new skin lesions- other htan one long term lesion on left foot that has grown with her overtime.  8. Birth control/STD check- Patient had Nexplanon in place but was removed last year in October (family planning right now)-  she is monogamous so no STD screening  9. Osteoporosis screening at 12- consider  at a later date -never smoker.  Status of chronic or acute concerns   # Anxiety and ADD S:Medication: Adderall 20 mg XR, Trazodone 50 mg before bed, and alprazolam as needed for anxiety (mainly flights) - patient is followed by Dr. Toy Care  Counseling: not at present. Doing awakening counseling with husband A/P: Reasonably well controlled - continue current medications    # asthma- followed by Dr. Ernst Bowler- doing well on trelegy 100-62.5-25 mcg/inh one puff daily, singulair .Rescue inhaler sparingly if its hot - once a month still   #allergies- on allegra and flonase and singular . Reasonable control  #Vitamin D deficiency S: Medication: 50k units was helpful- ran out and is going to buy some OTC for this Last vitamin D Lab Results  Component Value Date   VD25OH 35 06/23/2020  A/P: off meds-  check levels and may need a boost.   #closed fracture of distal radius- saw Dr. Erlinda Hong last year- still has some mild lingering pain on ulnar side of wrist. Overall much better- was not able to get into pt/ot due to insurance issues but since she feels better wants to hold off Recommended follow up: Return in about 1 year (around 07/16/2022)  for physical or sooner if needed.   Lab/Order associations: fasting   ICD-10-CM   1. Preventative health care  Z00.00 CBC with Differential/Platelet    Comprehensive metabolic panel    Lipid panel    VITAMIN D 25 Hydroxy (Vit-D Deficiency, Fractures)    TSH    2. Attention deficit disorder (ADD) in adult  F98.8     3. Generalized anxiety disorder  F41.1 TSH    4. Allergic rhinitis, unspecified seasonality, unspecified trigger  J30.9     5. Moderate persistent asthma, uncomplicated  O67.12     6. Screening exam for skin cancer  Z12.83 Ambulatory referral to Dermatology    7. Mild hyperlipidemia  E78.5 CBC with Differential/Platelet    Comprehensive metabolic panel    Lipid panel    TSH    8. Vitamin D deficiency  E55.9 VITAMIN D 25 Hydroxy (Vit-D Deficiency, Fractures)      No orders of the defined types were placed in this encounter.  I,Harris Phan,acting as a Education administrator for Garret Reddish, MD.,have documented all relevant documentation on the behalf of Garret Reddish, MD,as directed by  Garret Reddish, MD while in the presence of Garret Reddish, MD.  I, Garret Reddish, MD, have reviewed all documentation for this visit. The documentation on 07/16/21 for the exam, diagnosis, procedures, and orders are all accurate and complete.   Return precautions advised.  Garret Reddish, MD

## 2021-07-16 NOTE — Patient Instructions (Addendum)
Health Maintenance Due  Topic Date Due   COVID-19 Vaccine (3 - Booster for [employer] series) Will send a Clinical cytogeneticist message with the dates.  05/04/2020   INFLUENZA VACCINE Patient will get this done at work soon as her job starts giving them out.  06/22/2021   Sign release of information at the check out desk for last pap smear or send [place] a copy of your info on mychart  We will call you within two weeks about your referral to Dermatology- definitely have them look at spot on foot. If you do not hear within 2 weeks, give [place] a call.     Recommended follow up: Return in about 1 year (around 07/16/2022) for physical or sooner if needed.

## 2021-07-17 NOTE — Telephone Encounter (Signed)
Hi Katherine Wise, were the other questions in this message addressed?

## 2021-07-23 ENCOUNTER — Other Ambulatory Visit (HOSPITAL_COMMUNITY): Payer: Self-pay

## 2021-08-06 ENCOUNTER — Other Ambulatory Visit (HOSPITAL_COMMUNITY): Payer: Self-pay

## 2021-08-26 ENCOUNTER — Other Ambulatory Visit (HOSPITAL_COMMUNITY): Payer: Self-pay

## 2021-09-08 ENCOUNTER — Encounter: Payer: Self-pay | Admitting: Family Medicine

## 2021-10-02 ENCOUNTER — Other Ambulatory Visit (HOSPITAL_COMMUNITY): Payer: Self-pay

## 2021-10-02 MED ORDER — TRELEGY ELLIPTA 100-62.5-25 MCG/ACT IN AEPB
INHALATION_SPRAY | RESPIRATORY_TRACT | 2 refills | Status: DC
  Filled 2021-10-02: qty 60, 30d supply, fill #0
  Filled 2021-11-06: qty 60, 30d supply, fill #1
  Filled 2022-02-11: qty 60, 30d supply, fill #2

## 2021-10-05 ENCOUNTER — Other Ambulatory Visit (HOSPITAL_COMMUNITY): Payer: Self-pay

## 2021-10-08 ENCOUNTER — Other Ambulatory Visit: Payer: Self-pay | Admitting: Allergy & Immunology

## 2021-10-08 ENCOUNTER — Other Ambulatory Visit (HOSPITAL_COMMUNITY): Payer: Self-pay

## 2021-10-08 MED ORDER — MONTELUKAST SODIUM 10 MG PO TABS
ORAL_TABLET | Freq: Every day | ORAL | 0 refills | Status: DC
  Filled 2021-10-08: qty 90, 90d supply, fill #0

## 2021-11-06 ENCOUNTER — Other Ambulatory Visit (HOSPITAL_COMMUNITY): Payer: Self-pay

## 2021-11-10 ENCOUNTER — Other Ambulatory Visit: Payer: Self-pay

## 2021-11-10 ENCOUNTER — Ambulatory Visit: Payer: No Typology Code available for payment source | Admitting: Allergy & Immunology

## 2021-11-10 VITALS — BP 112/70 | HR 97 | Temp 97.6°F | Resp 16 | Ht 67.0 in | Wt 153.2 lb

## 2021-11-10 DIAGNOSIS — J4540 Moderate persistent asthma, uncomplicated: Secondary | ICD-10-CM

## 2021-11-10 DIAGNOSIS — J302 Other seasonal allergic rhinitis: Secondary | ICD-10-CM

## 2021-11-10 DIAGNOSIS — R21 Rash and other nonspecific skin eruption: Secondary | ICD-10-CM | POA: Diagnosis not present

## 2021-11-10 DIAGNOSIS — J3089 Other allergic rhinitis: Secondary | ICD-10-CM

## 2021-11-10 NOTE — Patient Instructions (Addendum)
1. Moderate persistent asthma, uncomplicated - Lung testing looks good today.  - We are not going to make any changes today. - Try spacing out to every other day. - Daily controller medication(s): Trelegy 100/62.5/25 one puff once daily + Singulair 10mg  daily - Prior to physical activity: ProAir 2 puffs 10-15 minutes before physical activity. - Rescue medications: ProAir 4 puffs every 4-6 hours as needed - Asthma control goals:  * Full participation in all desired activities (may need albuterol before activity) * Albuterol use two time or less a week on average (not counting use with activity) * Cough interfering with sleep two time or less a month * Oral steroids no more than once a year * No hospitalizations  2. Perennial allergic rhinitis - Continue with fluticasone 1-2 sprays per nostril up to twice daily during the worst times of the year.  - Continue with alternating antihistamines.  - I think we can hold off on allergy shots.   3. Return in about 6 months (around 05/11/2022).    Please inform 05/13/2022 of any Emergency Department visits, hospitalizations, or changes in symptoms. Call us before going to the ED for breathing or allergy symptoms since we might be able to fit you in for a sick visit. Feel free to contact us anytime with any questions, problems, or concerns.  It was a pleasure to see you again today! Hope that wrist heals!   Websites that have reliable patient information: 1. American Academy of Asthma, Allergy, and Immunology: www.aaaai.org 2. Food Allergy Research and Education (FARE): foodallergy.org 3. Mothers of Asthmatics: http://www.asthmacommunitynetwork.org 4. American College of Allergy, Asthma, and Immunology: www.acaai.org   COVID-19 Vaccine Information can be found at: [place] For questions related to vaccine distribution or appointments, please email [email or web address] or call  [phone].     Like [phone] on [place] and Instagram for our latest updates!       Make sure you are registered to vote! If you have moved or changed any of your contact information, you will need to get this updated before voting!  In some cases, you MAY be able to register to vote online: [employer]

## 2021-11-10 NOTE — Progress Notes (Addendum)
FOLLOW UP  Date of Service/Encounter:  11/10/21   Assessment:   Moderate persistent asthma without complication   Seasonal and perennial allergic rhinitis (grasses, weeds, indoor molds, outdoor molds, dust mite)    Fully vaccinated and boosted to [employer] worker   Left radius fracture - healed without surgery   Rash of hands - on extensor surface, but lasts only 1-3 hours (occurs 1-2 times per week)  Plan/Recommendations:    1. Moderate persistent asthma, uncomplicated - Lung testing looks good today.  - We are not going to make any changes today. - Try spacing out to every other day. - Daily controller medication(s): Trelegy 100/62.5/25 one puff once daily + Singulair 10mg  daily - Prior to physical activity: ProAir 2 puffs 10-15 minutes before physical activity. - Rescue medications: ProAir 4 puffs every 4-6 hours as needed - Asthma control goals:  * Full participation in all desired activities (may need albuterol before activity) * Albuterol use two time or less a week on average (not counting use with activity) * Cough interfering with sleep two time or less a month * Oral steroids no more than once a year * No hospitalizations  2. Perennial allergic rhinitis - Continue with fluticasone 1-2 sprays per nostril up to twice daily during the worst times of the year.  - Continue with alternating antihistamines.  - I think we can hold off on allergy shots.   3.  Rash - Obtaining labs to rule out autoimmune causes of this rash. - Rash almost appears consistent with Raynaud's phenomenon, although she reports that this occurs in all weather (and not just cold weather). - Autoimmune history is largely absent in her family.   4. Return in about 6 months (around 05/11/2022).    Subjective:   Katherine Wise is a 30 y.o. female presenting today for follow up of No chief complaint on file.   Katherine Wise has a history of the following: Patient Active  Problem List   Diagnosis Date Noted   Moderate persistent asthma, uncomplicated 11/16/2020   Seasonal and perennial allergic rhinitis 11/16/2020   Asthma 01/16/2016   Allergic rhinitis 01/16/2016   Attention deficit disorder (ADD) in adult 03/18/2015   Dysmenorrhea 03/18/2015   Generalized anxiety disorder 01/18/2014    History obtained from: chart review and patient.  Katherine Wise is a 30 y.o. female presenting for a follow up visit.  She was last seen in June 2022.  At that time, lung testing looked amazing.  We continue with Trelegy 100 mcg 1 puff once daily as well as Singulair 10 mg daily.  For her rhinitis, would continue with Flonase as well as alternating antihistamines.  For her rash, we started triamcinolone 0.1% ointment twice daily as needed.  Since last visit, she has done well. She actually got COVID for the 3rd time. It seemed to be less severe this time around. She did not take Paxlovid. 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 She has had a low census recently.   Asthma/Respiratory Symptom History: Breathing is under good control. She feels that when she has some problems, it is more anxiety related more than anything rather than true asthma. She rarely if ever uses her rescue inhaler. She takes it when she goes to the gym. She reports good sleep at night. Trelegy remains affordable.  She has to keep using a different email address to get the co-pay card, but she is fine with that.  She may  start using her husband's email address.  Allergic Rhinitis Symptom History: She has fluticasone to use for her allergic rhinitis.  Mostly, she is Benadryl as needed.  She does not have any side effects of Benadryl including sedation, so this works well for her.  She never was on allergen immunotherapy.  Her last testing was done in 2017 by Dr. Willa Rough.  At that time, she was positive to grass, indoor and outdoor molds, weeds, and dust mites.  X-ray results do not confirm dust mites, but in  her note, Dr. Willa Rough mentions dust mite reactivity.  Skin Symptom History: She continues to have these intermittent rashes on her hands.  They occur throughout the day.  She has tried using triamcinolone without much relief.  She never has seen a dermatologist.  She does think that a referral was put in.  Review of her chart shows that she did have a referral in August 2022.  She never received any call about it.  This was done by her PCP.  She is still enjoying her job.  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  She continues to work at [employer].  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  Otherwise, there have been no changes to her past medical history, surgical history, family history, or social history.    Review of Systems  Constitutional: Negative.  Negative for chills, fever, malaise/fatigue and weight loss.  HENT:  Positive for congestion. Negative for ear discharge, ear pain and sinus pain.   Eyes:  Negative for pain, discharge and redness.  Respiratory:  Positive for cough. Negative for sputum production, shortness of breath and wheezing.   Cardiovascular: Negative.  Negative for chest pain and palpitations.  Gastrointestinal:  Negative for abdominal pain, constipation, diarrhea, heartburn, nausea and vomiting.  Skin: Negative.  Negative for itching and rash.  Neurological:  Negative for dizziness and headaches.  Endo/Heme/Allergies:  Positive for environmental allergies. Does not bruise/bleed easily.      Objective:   Vitals:   11/10/21 1023  BP: 112/70  Pulse: 97  Resp: 16  Temp: 97.6 F (36.4 C)  SpO2: 100%      Physical Exam Vitals reviewed.  Constitutional:      Appearance: Normal appearance.  HENT:     Head: Normocephalic and atraumatic.     Right Ear: Tympanic membrane, ear canal and external ear normal.     Left Ear: Tympanic membrane, ear canal and external ear normal.     Nose: Rhinorrhea  present. No congestion.     Mouth/Throat:     Mouth: Mucous membranes are moist.  Eyes:     Extraocular Movements: Extraocular movements intact.     Conjunctiva/sclera: Conjunctivae normal.     Pupils: Pupils are equal, round, and reactive to light.  Cardiovascular:     Rate and Rhythm: Normal rate and regular rhythm.     Pulses: Normal pulses.     Heart sounds: No murmur heard.   No gallop.  Pulmonary:     Effort: No respiratory distress.     Breath sounds: No wheezing or rales.  Abdominal:     General: Abdomen is flat. Bowel sounds are normal.  Musculoskeletal:     Cervical back: Normal range of motion.  Skin:    Capillary Refill: Capillary refill takes less than 2 seconds.     Findings: No erythema or rash.  Neurological:     Mental Status:  She is alert.     Diagnostic studies: labs sent instead      Malachi Bonds, MD  [facility]

## 2021-11-11 ENCOUNTER — Encounter: Payer: Self-pay | Admitting: Allergy & Immunology

## 2021-11-13 LAB — CBC WITH DIFFERENTIAL
Basophils Absolute: 0 10*3/uL (ref 0.0–0.2)
Basos: 0 %
EOS (ABSOLUTE): 0.1 10*3/uL (ref 0.0–0.4)
Eos: 1 %
Hematocrit: 44.7 % (ref 34.0–46.6)
Hemoglobin: 14.9 g/dL (ref 11.1–15.9)
Immature Grans (Abs): 0 10*3/uL (ref 0.0–0.1)
Immature Granulocytes: 0 %
Lymphocytes Absolute: 1.6 10*3/uL (ref 0.7–3.1)
Lymphs: 22 %
MCH: 30.3 pg (ref 26.6–33.0)
MCHC: 33.3 g/dL (ref 31.5–35.7)
MCV: 91 fL (ref 79–97)
Monocytes Absolute: 0.5 10*3/uL (ref 0.1–0.9)
Monocytes: 7 %
Neutrophils Absolute: 5 10*3/uL (ref 1.4–7.0)
Neutrophils: 70 %
RBC: 4.92 x10E6/uL (ref 3.77–5.28)
RDW: 12.4 % (ref 11.7–15.4)
WBC: 7.3 10*3/uL (ref 3.4–10.8)

## 2021-11-13 LAB — ALLERGENS W/COMP RFLX AREA 2
Alternaria Alternata IgE: <0.1 kU/L
Aspergillus Fumigatus IgE: <0.1 kU/L
Bermuda Grass IgE: <0.1 kU/L
Cedar, Mountain IgE: <0.1 kU/L
Cladosporium Herbarum IgE: <0.1 kU/L
Cockroach, German IgE: <0.1 kU/L
Common Silver Birch IgE: <0.1 kU/L
Cottonwood IgE: <0.1 kU/L
D Farinae IgE: 0.12 kU/L — AB
D Pteronyssinus IgE: <0.1 kU/L
E001-IgE Cat Dander: <0.1 kU/L
E005-IgE Dog Dander: <0.1 kU/L
Elm, American IgE: <0.1 kU/L
IgE (Immunoglobulin E), Serum: 7 IU/mL (ref 6–495)
Johnson Grass IgE: <0.1 kU/L
Maple/Box Elder IgE: <0.1 kU/L
Mouse Urine IgE: <0.1 kU/L
Oak, White IgE: <0.1 kU/L
Pecan, Hickory IgE: <0.1 kU/L
Penicillium Chrysogen IgE: <0.1 kU/L
Pigweed, Rough IgE: <0.1 kU/L
Ragweed, Short IgE: <0.1 kU/L
Sheep Sorrel IgE Qn: <0.1 kU/L
Timothy Grass IgE: <0.1 kU/L
White Mulberry IgE: <0.1 kU/L

## 2021-11-13 LAB — ANTINUCLEAR ANTIBODIES, IFA: ANA Titer 1: NEGATIVE

## 2021-11-13 LAB — C-REACTIVE PROTEIN: CRP: <1 mg/L (ref 0–10)

## 2021-11-13 LAB — SEDIMENTATION RATE: Sed Rate: 2 mm/hr (ref 0–32)

## 2021-11-17 NOTE — Addendum Note (Signed)
Addended by: Alfonse Spruce on: 11/17/2021 08:01 AM   Modules accepted: Orders

## 2021-11-18 NOTE — Progress Notes (Signed)
Patient states she finally got a call from [facility].  She is scheduled out into June but their office stated to me they have opened up more dates and have sooner towards the end of January. They requested the patient give them a call so she can be seen sooner.  Patient has been informed of this information & will call their office.

## 2021-12-12 ENCOUNTER — Other Ambulatory Visit: Payer: Self-pay

## 2021-12-12 ENCOUNTER — Emergency Department (HOSPITAL_BASED_OUTPATIENT_CLINIC_OR_DEPARTMENT_OTHER)
Admission: EM | Admit: 2021-12-12 | Discharge: 2021-12-12 | Disposition: A | Discharge status: Home / Self Care | Payer: No Typology Code available for payment source | Attending: Emergency Medicine | Admitting: Emergency Medicine

## 2021-12-12 ENCOUNTER — Emergency Department (HOSPITAL_BASED_OUTPATIENT_CLINIC_OR_DEPARTMENT_OTHER): Payer: No Typology Code available for payment source

## 2021-12-12 ENCOUNTER — Encounter (HOSPITAL_BASED_OUTPATIENT_CLINIC_OR_DEPARTMENT_OTHER): Payer: Self-pay

## 2021-12-12 DIAGNOSIS — W274XXA Contact with kitchen utensil, initial encounter: Secondary | ICD-10-CM | POA: Diagnosis not present

## 2021-12-12 DIAGNOSIS — S61111A Laceration without foreign body of right thumb with damage to nail, initial encounter: Secondary | ICD-10-CM | POA: Diagnosis not present

## 2021-12-12 DIAGNOSIS — S61219A Laceration without foreign body of unspecified finger without damage to nail, initial encounter: Secondary | ICD-10-CM

## 2021-12-12 DIAGNOSIS — Y92000 Kitchen of unspecified non-institutional (private) residence as  the place of occurrence of the external cause: Secondary | ICD-10-CM | POA: Insufficient documentation

## 2021-12-12 DIAGNOSIS — S61011A Laceration without foreign body of right thumb without damage to nail, initial encounter: Secondary | ICD-10-CM

## 2021-12-12 DIAGNOSIS — Z23 Encounter for immunization: Secondary | ICD-10-CM | POA: Insufficient documentation

## 2021-12-12 MED ORDER — CEPHALEXIN 250 MG PO CAPS
250.0000 mg | ORAL_CAPSULE | Freq: Once | ORAL | Status: AC
Start: 2021-12-12 — End: 2021-12-12
  Administered 2021-12-12: 250 mg via ORAL
  Filled 2021-12-12: qty 1

## 2021-12-12 MED ORDER — ONDANSETRON 8 MG PO TBDP: 8.0000 mg | ORAL_TABLET | Freq: Three times a day (TID) | ORAL | 0 refills | Status: DC | PRN

## 2021-12-12 MED ORDER — BUPIVACAINE HCL (PF) 0.5 % IJ SOLN
10.0000 mL | Freq: Once | INTRAMUSCULAR | Status: AC
  Administered 2021-12-12: 10 mL
  Filled 2021-12-12: qty 10

## 2021-12-12 MED ORDER — CEPHALEXIN 500 MG PO CAPS
500.0000 mg | ORAL_CAPSULE | Freq: Four times a day (QID) | ORAL | 0 refills | Status: DC
Start: 2021-12-12 — End: 2022-07-16

## 2021-12-12 MED ORDER — OXYCODONE-ACETAMINOPHEN 5-325 MG PO TABS
1.0000 | ORAL_TABLET | Freq: Once | ORAL | Status: AC
  Administered 2021-12-12: 1 via ORAL
  Filled 2021-12-12: qty 1

## 2021-12-12 MED ORDER — HYDROCODONE-ACETAMINOPHEN 5-325 MG PO TABS
2.0000 | ORAL_TABLET | ORAL | 0 refills | Status: DC | PRN
Start: 2021-12-12 — End: 2022-07-16

## 2021-12-12 MED ORDER — TETANUS-DIPHTH-ACELL PERTUSSIS 5-2.5-18.5 LF-MCG/0.5 IM SUSY
0.5000 mL | PREFILLED_SYRINGE | Freq: Once | INTRAMUSCULAR | Status: AC
  Administered 2021-12-12: 0.5 mL via INTRAMUSCULAR
  Filled 2021-12-12: qty 0.5

## 2021-12-12 MED ORDER — ONDANSETRON 4 MG PO TBDP
4.0000 mg | ORAL_TABLET | Freq: Once | ORAL | Status: AC
  Administered 2021-12-12: 4 mg via ORAL
  Filled 2021-12-12: qty 1

## 2021-12-12 NOTE — ED Provider Notes (Signed)
[facility] DEPT Provider Note   CSN: [id number] Arrival date & time: 12/12/21  1802     History  Chief Complaint  Patient presents with   Thumb lac.    Katherine Wise is a 31 y.o. female.  HPI  Patient presents with a right thumb laceration.  Patient is right-hand dominant, happened when she was cutting food with a mandolin earlier tonight.  Unsure when her last tetanus was.  Bleeding controlled with pressure, involves the nailbed.  Home Medications Prior to Admission medications   Medication Sig Start Date End Date Taking? Authorizing Provider  cephALEXin (KEFLEX) 500 MG capsule Take 1 capsule (500 mg total) by mouth 4 (four) times daily. 12/12/21  Yes Sherrill Raring, PA-C  albuterol (VENTOLIN HFA) 108 (90 Base) MCG/ACT inhaler Use 2 puffs every four hours as needed for cough or wheeze.  May use 2 puffs 10-20 minutes prior to exercise. 03/29/19   Valentina Shaggy, MD  amphetamine-dextroamphetamine (ADDERALL XR) 20 MG 24 hr capsule Take 2 capsules by mouth daily. 04/16/21     amphetamine-dextroamphetamine (ADDERALL) 20 MG tablet Take 1 tablet by mouth once daily 04/16/21     Ascorbic Acid (VITAMIN C PO) Take by mouth.    [provider]  Ashwagandha 500 MG CAPS See admin instructions.    [provider]  COLLAGEN PO Take by mouth. Patient not taking: Reported on 11/10/2021    [provider]  cyclobenzaprine (FLEXERIL) 5 MG tablet Take 1 tablet (5 mg total) by mouth daily as needed for muscle spasms. 03/21/18   Leandrew Koyanagi, MD  fexofenadine (ALLEGRA) 180 MG tablet Take 1 tablet (180 mg total) by mouth daily. 06/28/18   Valentina Shaggy, MD  fluticasone [facility]) 50 MCG/ACT nasal spray Place 1-2 sprays into both nostrils 2 (two) times a day. During the worst times of the year. 03/29/19   Valentina Shaggy, MD  Fluticasone-Umeclidin-Vilant (TRELEGY ELLIPTA) 100-62.5-25 MCG/ACT AEPB Inhale 1 puff into the lungs daily. 06/16/21    Valentina Shaggy, MD  montelukast (SINGULAIR) 10 MG tablet TAKE 1 TABLET BY MOUTH EVERY NIGHT AT BEDTIME 10/08/21 10/08/22  Valentina Shaggy, MD  Multiple Vitamins-Minerals (ZINC PO) Take by mouth.    [provider]  Probiotic TBEC See admin instructions.    [provider]  traZODone (DESYREL) 50 MG tablet Take 1-3 tablets by mouth at bedtime 04/16/21     triamcinolone ointment (KENALOG) 0.1 % Apply 1 application topically 2 (two) times daily. 05/12/21   Valentina Shaggy, MD  VITAMIN D PO Take by mouth daily.    [provider]      Allergies    Patient has no known allergies.    Review of Systems   Review of Systems  Skin:  Positive for wound.   Physical Exam Updated Vital Signs BP (!) 132/98 (BP Location: Left Arm)    Pulse 78    Temp 98.4 F (36.9 C) (Oral)    Resp 16    SpO2 95%  Physical Exam Vitals and nursing note reviewed. Exam conducted with a chaperone present.  Constitutional:      General: She is not in acute distress.    Appearance: Normal appearance.  HENT:     Head: Normocephalic and atraumatic.  Eyes:     General: No scleral icterus.    Extraocular Movements: Extraocular movements intact.     Pupils: Pupils are equal, round, and reactive to light.  Cardiovascular:  Pulses: Normal pulses.  Musculoskeletal:        General: Tenderness present. Normal range of motion.  Skin:    Capillary Refill: Capillary refill takes less than 2 seconds.     Coloration: Skin is not jaundiced.     Findings: Erythema present.     Comments: 2 cm laceration involving the nailbed  Neurological:     Mental Status: She is alert. Mental status is at baseline.     Coordination: Coordination normal.     ED Results / Procedures / Treatments   Labs (all labs ordered are listed, but only abnormal results are displayed) Labs Reviewed - No data to display  EKG None  Radiology DG Finger Thumb Right  Result Date: 12/12/2021 CLINICAL  DATA:  Laceration EXAM: RIGHT THUMB 2+V COMPARISON:  None. FINDINGS: There is no evidence of fracture or dislocation. There is no evidence of arthropathy or other focal bone abnormality. Soft tissues are unremarkable. IMPRESSION: Negative. Electronically Signed   By: Donavan Foil M.D.   On: 12/12/2021 20:26    Procedures .[facility] KitchenLaceration Repair  Date/Time: 12/12/2021 10:44 PM Performed by: Sherrill Raring, PA-C Authorized by: Sherrill Raring, PA-C   Consent:    Consent obtained:  Verbal   Consent given by:  Patient   Risks discussed:  Infection, need for additional repair, pain, poor cosmetic result and poor wound healing   Alternatives discussed:  No treatment and delayed treatment Universal protocol:    Procedure explained and questions answered to patient or proxy's satisfaction: yes     Relevant documents present and verified: yes     Test results available: yes     Imaging studies available: yes     Required blood products, implants, devices, and special equipment available: yes     Site/side marked: yes     Immediately prior to procedure, a time out was called: yes     Patient identity confirmed:  Verbally with patient Anesthesia:    Anesthesia method:  Local infiltration and nerve block   Local anesthetic:  Bupivacaine 0.25% w/o epi   Block location:  Thumb   Block needle gauge:  25 G   Block injection procedure:  Anatomic landmarks identified, introduced needle, incremental injection, negative aspiration for blood and anatomic landmarks palpated   Block outcome:  Anesthesia achieved Laceration details:    Location:  Finger   Finger location:  R thumb   Length (cm):  2 Pre-procedure details:    Preparation:  Patient was prepped and draped in usual sterile fashion and imaging obtained to evaluate for foreign bodies Exploration:    Hemostasis achieved with:  Direct pressure   Imaging obtained: x-ray     Imaging outcome: foreign body not noted     Wound exploration: wound explored  through full range of motion and entire depth of wound visualized     Contaminated: no   Treatment:    Area cleansed with:  Povidone-iodine   Amount of cleaning:  Standard   Irrigation solution:  Sterile saline   Irrigation volume:  500   Irrigation method:  Pressure wash   Visualized foreign bodies/material removed: no     Debridement:  None   Undermining:  None Skin repair:    Repair method:  Sutures   Suture size:  6-0   Suture material:  Chromic gut   Suture technique:  Simple interrupted   Number of sutures:  6 Approximation:    Approximation:  Close Repair type:    Repair type:  Simple Post-procedure details:    Dressing:  Antibiotic ointment   Procedure completion:  Tolerated well, no immediate complications    Medications Ordered in ED Medications  cephALEXin (KEFLEX) capsule 250 mg (has no administration in time range)  bupivacaine (MARCAINE) 0.5 % injection 10 mL (10 mLs Infiltration Given 12/12/21 1851)  Tdap (BOOSTRIX) injection 0.5 mL (0.5 mLs Intramuscular Given 12/12/21 1848)    ED Course/ Medical Decision Making/ A&P                           Medical Decision Making Amount and/or Complexity of Data Reviewed Radiology: ordered.  Risk Prescription drug management.   This is a 31 year old female presenting with laceration to the right thumb.  Her vitals are stable, good radial pulse.  Able to flex and extend. Cap refill less than 2, radial pulse 2+.  Tetanus updated.  Laceration does involve the nailbed due to this we spoke with Dr. Fredna Dow who is on-call with hand surgery.  He advises to do so with 6-0 chromic and 6-0 Monocryl for the skin.  States we can remove the nail in the ED if comfortable, also states he can Dermabond the nailbed and have her follow-up with hand.  We will proceed with that option.  Discussed with the patient and she is in agreement to proceed.   Patient tolerated the procedure well.  Successful digital block, proceeded to approximate  the nail with Dermabond.  Patient tolerated the procedure well.  Will discharge with Keflex and have her follow-up with hand surgery.  Discharged in stable condition.  Discussed HPI, physical exam and plan of care for this patient with attending Lavenia Atlas. The attending physician evaluated this patient as part of a shared visit and agrees with plan of care.        Final Clinical Impression(s) / ED Diagnoses Final diagnoses:  Thumb laceration, right, initial encounter    Rx / DC Orders ED Discharge Orders          Ordered    cephALEXin (KEFLEX) 500 MG capsule  4 times daily        12/12/21 [place], Kellene Mccleary, PA-C 12/12/21 2246    Lorelle Gibbs, DO 12/13/21 1514

## 2021-12-12 NOTE — ED Triage Notes (Signed)
She states she lac. Her right thumb while using a mandolin device in her kitchen. She has lac. Which extends ~35m into ant. Thumbnail area at distal thumb. She has held pressure and it is minimally bleeding at present. She was met upon arrival by our P.A., HHildred Alamin

## 2021-12-12 NOTE — Discharge Instructions (Addendum)
Take Keflex 4 times daily for the next 5 days.  Sutures will dissolve on their own, the Dermabond will also breakdown on its own.  Keep dry for the next 24 hours, he can wash with soap and water after.  Apply topical mupirocin or Polysporin cream over the wound as needed.  Please call the hand surgeon to set up an appointment for next week for evaluation.

## 2021-12-12 NOTE — ED Notes (Signed)
Non stick dressing applied to repaired lac

## 2021-12-12 NOTE — ED Notes (Signed)
Deep lac to right thumb, edges approximated and no bleeding at this time.  Applied non stick dressing while pt is going to the restroom.

## 2021-12-12 NOTE — ED Notes (Signed)
MD and PA at bedside for lac repair.  Snack given to pt

## 2021-12-15 ENCOUNTER — Encounter: Payer: Self-pay | Admitting: Family Medicine

## 2021-12-15 DIAGNOSIS — S61319A Laceration without foreign body of unspecified finger with damage to nail, initial encounter: Secondary | ICD-10-CM

## 2021-12-18 ENCOUNTER — Other Ambulatory Visit: Payer: Self-pay

## 2021-12-18 DIAGNOSIS — S61319A Laceration without foreign body of unspecified finger with damage to nail, initial encounter: Secondary | ICD-10-CM

## 2022-01-08 ENCOUNTER — Other Ambulatory Visit (HOSPITAL_COMMUNITY): Payer: Self-pay

## 2022-01-08 MED ORDER — AMPHETAMINE-DEXTROAMPHETAMINE 20 MG PO TABS
ORAL_TABLET | Freq: Every day | ORAL | 0 refills | Status: DC
  Filled 2022-01-08: qty 90, 90d supply, fill #0

## 2022-01-08 MED ORDER — AMPHETAMINE-DEXTROAMPHET ER 20 MG PO CP24
ORAL_CAPSULE | ORAL | 0 refills | Status: DC
  Filled 2022-01-08: qty 180, 90d supply, fill #0

## 2022-02-03 ENCOUNTER — Encounter: Payer: Self-pay | Admitting: Family Medicine

## 2022-02-03 ENCOUNTER — Other Ambulatory Visit: Payer: Self-pay

## 2022-02-03 DIAGNOSIS — M21619 Bunion of unspecified foot: Secondary | ICD-10-CM

## 2022-02-11 ENCOUNTER — Other Ambulatory Visit (HOSPITAL_COMMUNITY): Payer: Self-pay

## 2022-02-12 ENCOUNTER — Ambulatory Visit (INDEPENDENT_AMBULATORY_CARE_PROVIDER_SITE_OTHER): Payer: No Typology Code available for payment source

## 2022-02-12 ENCOUNTER — Ambulatory Visit: Payer: No Typology Code available for payment source | Admitting: Podiatry

## 2022-02-12 ENCOUNTER — Other Ambulatory Visit (HOSPITAL_COMMUNITY): Payer: Self-pay

## 2022-02-12 ENCOUNTER — Other Ambulatory Visit: Payer: Self-pay

## 2022-02-12 DIAGNOSIS — Q6671 Congenital pes cavus, right foot: Secondary | ICD-10-CM | POA: Diagnosis not present

## 2022-02-12 DIAGNOSIS — Q667 Congenital pes cavus, unspecified foot: Secondary | ICD-10-CM

## 2022-02-12 DIAGNOSIS — M2012 Hallux valgus (acquired), left foot: Secondary | ICD-10-CM

## 2022-02-15 ENCOUNTER — Telehealth: Payer: Self-pay

## 2022-02-15 NOTE — Telephone Encounter (Signed)
Casts sent to [facility] ?

## 2022-02-17 NOTE — Progress Notes (Signed)
?Subjective:  ?Patient ID: Katherine Wise, female    DOB: 10/20/1991,  MRN: [id number] ? ?Chief Complaint  ?Patient presents with  ? Callouses  ? Bunions  ? ? ?31 y.o. female presents with the above complaint.  Patient presents with complaint of bilateral pinch callus to both hallux.  Patient states that has been going for quite some time is progressive gotten worse.  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  She is having problems on it.  She wanted get it evaluated she denies any other acute, she does not wear any orthotics.  She wears good shoes.  She would like to discuss treatment options for this. ? ? ?Review of Systems: Negative except as noted in the HPI. Denies N/V/F/Ch. ? ?Past Medical History:  ?Diagnosis Date  ? Abnormal Pap smear of cervix 2020  ? ADHD (attention deficit hyperactivity disorder) 03/18/2015  ? Dr. Evelene Croon psychiatry. ? OCD taking Adderall XR 20mg . May be changing due to anxiety   ? Dysmenorrhea 03/18/2015  ? Birth control- Junel improved this.  As of 03/18/15 in homosexual relationship and not active with males-trialing off. Will see if has regular periods. Concerned about libido.    ? Exercise-induced asthma   ? Generalized anxiety disorder 01/18/2014  ? Dr. 01/20/2014 psychiatry. Evelene Croon for counseling. Xanax 0.5mg  BID prn.  Trazodone for sleep SSRI tried (lexapro) and felt "numb" and got speeding ticket and didn't care   ? 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 ? ?Current Outpatient Medications:  ?  albuterol (VENTOLIN HFA) 108 (90 Base) MCG/ACT inhaler, Use 2 puffs every four hours as needed for cough or wheeze.  May use 2 puffs 10-20 minutes prior to exercise., Disp: 1 Inhaler, Rfl: 2 ?  amphetamine-dextroamphetamine (ADDERALL XR) 20 MG 24 hr capsule, Take 2 capsules by mouth daily., Disp: 180 capsule, Rfl: 0 ?  amphetamine-dextroamphetamine (ADDERALL XR) 20 MG 24 hr capsule, Take 2 capsules by mouth every morning, Disp: 180 capsule, Rfl: 0 ?  amphetamine-dextroamphetamine (ADDERALL)  20 MG tablet, Take 1 tablet by mouth once daily, Disp: 90 tablet, Rfl: 0 ?  amphetamine-dextroamphetamine (ADDERALL) 20 MG tablet, Take 1 tablet by mouth daily, Disp: 90 tablet, Rfl: 0 ?  Ascorbic Acid (VITAMIN C PO), Take by mouth., Disp: , Rfl:  ?  Ashwagandha 500 MG CAPS, See admin instructions., Disp: , Rfl:  ?  cephALEXin (KEFLEX) 500 MG capsule, Take 1 capsule (500 mg total) by mouth 4 (four) times daily., Disp: 20 capsule, Rfl: 0 ?  COLLAGEN PO, Take by mouth. (Patient not taking: Reported on 11/10/2021), Disp: , Rfl:  ?  cyclobenzaprine (FLEXERIL) 5 MG tablet, Take 1 tablet (5 mg total) by mouth daily as needed for muscle spasms., Disp: 30 tablet, Rfl: 3 ?  fexofenadine (ALLEGRA) 180 MG tablet, Take 1 tablet (180 mg total) by mouth daily., Disp: 30 tablet, Rfl: 5 ?  fluticasone (FLONASE) 50 MCG/ACT nasal spray, Place 1-2 sprays into both nostrils 2 (two) times a day. During the worst times of the year., Disp: 16 g, Rfl: 5 ?  Fluticasone-Umeclidin-Vilant (TRELEGY ELLIPTA) 100-62.5-25 MCG/ACT AEPB, Inhale 1 puff into the lungs daily., Disp: 60 each, Rfl: 2 ?  HYDROcodone-acetaminophen (NORCO/VICODIN) 5-325 MG tablet, Take 2 tablets by mouth every 4 (four) hours as needed., Disp: 6 tablet, Rfl: 0 ?  montelukast (SINGULAIR) 10 MG tablet, TAKE 1 TABLET BY MOUTH EVERY NIGHT AT BEDTIME, Disp: 90 tablet, Rfl: 0 ?  Multiple Vitamins-Minerals (ZINC PO),  Take by mouth., Disp: , Rfl:  ?  ondansetron (ZOFRAN-ODT) 8 MG disintegrating tablet, Take 1 tablet (8 mg total) by mouth every 8 (eight) hours as needed for nausea or vomiting., Disp: 20 tablet, Rfl: 0 ?  Probiotic TBEC, See admin instructions., Disp: , Rfl:  ?  traZODone (DESYREL) 50 MG tablet, Take 1-3 tablets by mouth at bedtime, Disp: 270 tablet, Rfl: 4 ?  triamcinolone ointment (KENALOG) 0.1 %, Apply 1 application topically 2 (two) times daily., Disp: 30 g, Rfl: 5 ?  VITAMIN D PO, Take by mouth daily., Disp: , Rfl:  ? ?Social History  ? ?Tobacco Use  ?Smoking  Status Never  ?Smokeless Tobacco Never  ? ? ?No Known Allergies ?Objective:  ?There were no vitals filed for this visit. ?There is no height or weight on file to calculate BMI. ?Constitutional Well developed. ?Well nourished.  ?Vascular Dorsalis pedis pulses palpable bilaterally. ?Posterior tibial pulses palpable bilaterally. ?Capillary refill normal to all digits.  ?No cyanosis or clubbing noted. ?Pedal hair growth normal.  ?Neurologic Normal speech. ?Oriented to person, place, and time. ?Epicritic sensation to light touch grossly present bilaterally.  ?Dermatologic Nails well groomed and normal in appearance. ?No open wounds. ?No skin lesions.  ?Orthopedic: Bilateral hyperkeratotic lesion/pinch callus noted to both hallux is.  Bunion deformity noted to the left side primarily.  The left is greater than right side.  There is a moderate bunion deformity.  This is tracking not a track bound deformity.  No crepitus or pain noted with range of motion of the first MPJ joint.  ? ?Radiographs: 3 views of skeletally mature adult left foot: Moderate bunion deformity noted.  No osteoarthritic changes noted to the first MPJ joint.  Mild elevatus noted.  Pes cavus foot structure noted. ?Assessment:  ? ?1. Acquired hallux valgus of left foot   ?2. Pes cavus   ? ?Plan:  ?Patient was evaluated and treated and all questions answered. ? ?Left foot bunion deformity and bilateral pinch callus with underlying pes cavus ?-All questions and concerns were discussed with the patient in extensive detail.  I discussed etiology of bunion deformity and pinch calluses and various treatment options were extensively discussed. ?-Ultimately I believe she will benefit from orthotics and shoe gear modification.  At this time the bunion is not bothering her and therefore we will hold off on surgical intervention.  I encouraged her to follow-up with me if the orthotics are not working or the bunion started become progressively painful.  She states  understanding. ?-She was casted for orthotics ? ?No follow-ups on file.  ?

## 2022-03-18 ENCOUNTER — Telehealth: Payer: Self-pay

## 2022-03-18 NOTE — Telephone Encounter (Signed)
Foot orthotics are ready - left message for patient to call and schedule appointment ?

## 2022-03-23 ENCOUNTER — Ambulatory Visit: Payer: No Typology Code available for payment source

## 2022-03-23 DIAGNOSIS — Q667 Congenital pes cavus, unspecified foot: Secondary | ICD-10-CM

## 2022-03-23 DIAGNOSIS — M2012 Hallux valgus (acquired), left foot: Secondary | ICD-10-CM

## 2022-03-23 NOTE — Progress Notes (Signed)
SITUATION: ?Reason for Visit: Fitting and Delivery of Custom Fabricated Foot Orthoses ?Patient Report: Patient reports comfort and is satisfied with device. ? ?OBJECTIVE DATA: ?Patient History / Diagnosis:   ?  ICD-10-CM   ?1. Acquired hallux valgus of left foot  M20.12   ?  ?2. Pes cavus  Q66.70   ?  ? ? ?Provided Device:  Custom Functional Foot Orthotics ?    [employer]: [id number] ? ?GOAL OF ORTHOSIS ?- Improve gait ?- Decrease energy expenditure ?- Improve Balance ?- Provide Triplanar stability of foot complex ?- Facilitate motion ? ?ACTIONS PERFORMED ?Patient was fit with foot orthotics trimmed to shoe last. Patient tolerated fittign procedure.  ? ?Patient was provided with verbal and written instruction and demonstration regarding donning, doffing, wear, care, proper fit, function, purpose, cleaning, and use of the orthosis and in all related precautions and risks and benefits regarding the orthosis. ? ?Patient was also provided with verbal instruction regarding how to report any failures or malfunctions of the orthosis and necessary follow up care. Patient was also instructed to contact our office regarding any change in status that may affect the function of the orthosis. ? ?Patient demonstrated independence with proper donning, doffing, and fit and verbalized understanding of all instructions. ? ?PLAN: ?Patient is to follow up in one week or as necessary (PRN). All questions were answered and concerns addressed. Plan of care was discussed with and agreed upon by the patient. ? ?

## 2022-04-23 ENCOUNTER — Other Ambulatory Visit: Payer: Self-pay | Admitting: Allergy & Immunology

## 2022-04-23 ENCOUNTER — Other Ambulatory Visit (HOSPITAL_COMMUNITY): Payer: Self-pay

## 2022-04-23 MED ORDER — TRELEGY ELLIPTA 100-62.5-25 MCG/ACT IN AEPB
INHALATION_SPRAY | RESPIRATORY_TRACT | 0 refills | Status: DC
  Filled 2022-04-23: qty 60, 30d supply, fill #0

## 2022-05-11 ENCOUNTER — Ambulatory Visit (INDEPENDENT_AMBULATORY_CARE_PROVIDER_SITE_OTHER): Payer: No Typology Code available for payment source | Admitting: Allergy & Immunology

## 2022-05-11 ENCOUNTER — Encounter: Payer: Self-pay | Admitting: Allergy & Immunology

## 2022-05-11 VITALS — BP 112/60 | HR 70 | Temp 98.3°F | Resp 12 | Wt 150.6 lb

## 2022-05-11 DIAGNOSIS — J4540 Moderate persistent asthma, uncomplicated: Secondary | ICD-10-CM

## 2022-05-11 DIAGNOSIS — J3089 Other allergic rhinitis: Secondary | ICD-10-CM | POA: Diagnosis not present

## 2022-05-11 DIAGNOSIS — R21 Rash and other nonspecific skin eruption: Secondary | ICD-10-CM

## 2022-05-11 NOTE — Progress Notes (Signed)
FOLLOW UP  Date of Service/Encounter:  05/11/22   Assessment:   Moderate persistent asthma without complication - now doing well on Trelegy (and more compliant since it is more affordable)   Seasonal and perennial allergic rhinitis (grasses, weeds, indoor molds, outdoor molds, dust mite)    Fully vaccinated and boosted to [employer] worker   Rash of hands - on extensor surface, but lasts only 1-3 hours (occurs 1-2 times per week)  Plan/Recommendations:   1. Moderate persistent asthma, uncomplicated - Lung testing looks AMAZING today.  - We are not going to make any changes today. - I am glad that the Trelegy is covered so well.  - Daily controller medication(s): Trelegy 100/62.5/25 one puff once daily + Singulair 10mg  daily - Prior to physical activity: ProAir 2 puffs 10-15 minutes before physical activity. - Rescue medications: ProAir 4 puffs every 4-6 hours as needed - Asthma control goals:  * Full participation in all desired activities (may need albuterol before activity) * Albuterol use two time or less a week on average (not counting use with activity) * Cough interfering with sleep two time or less a month * Oral steroids no more than once a year * No hospitalizations  2. Perennial allergic rhinitis - Stop the fluticasone and start Xhance one spray per nostril twice daily (sample provided). - Hopefully this will help with nasal congestion and whatnot.  - Continue with alternating antihistamines as you are doing.  - I think we can hold off on allergy shots.   3. Return in about 6 months (around 11/10/2022).     Subjective:   Katherine Wise is a 31 y.o. female presenting today for follow up of  Chief Complaint  Patient presents with   Asthma    Fine   Allergic Rhinitis     This past weekend the weather has been bothering here. Other than that it is the same.    Katherine Wise has a history of the following: Patient Active Problem List    Diagnosis Date Noted   Moderate persistent asthma, uncomplicated 11/16/2020   Seasonal and perennial allergic rhinitis 11/16/2020   Asthma 01/16/2016   Allergic rhinitis 01/16/2016   Attention deficit disorder (ADD) in adult 03/18/2015   Dysmenorrhea 03/18/2015   Generalized anxiety disorder 01/18/2014    History obtained from: chart review and patient.  Katherine Wise is a 31 y.o. female presenting for a follow up visit.  She was last seen in December 2022.  At that time  Assessment recurrent.  We did not make any changes.  We continued with Trelegy 1 puff once daily as well as Singulair 10 mg daily.  For allergic rhinitis, we continue with Flonase up to twice daily as well as alternating antihistamines.  For the rash, which appeared almost like Raynaud's phenomenon, we did a lab work-up to rule out autoimmune causes and all of this was negative.  Since last visit, she has done well.   Asthma/Respiratory Symptom History: She is on the Trelegy and now the coupon covers nearly all of the cost. She was rationing it previously.  She has not needed steroids at all. She is not sleeping well at night, but this is not related to asthma. She was using a box fan and this was blowing on her face. Her body did not like it, but other than that she was fine. She uses the fan to block out of her husband's snoring. There have been discussions on this.  Allergic Rhinitis Symptom History: She has been alternating antihistamines. She has not been using the nasal spray. She never asked for at the pharmacy.   Skin Symptom History: She did go to see the Dermatologist. She had pictures but no active rash. It has been better but she is unsure what she has been doing to make it better. She has been trying not to eat weird things. She has cut back on the alcohol.   She has ointments to use as needed. She keeps it in her work bag to use as needed at work.   They were in [place] for ten days in April. They went to [place], then  [place], and then [place]. They had a good time.   Otherwise, there have been no changes to her past medical history, surgical history, family history, or social history.    Review of Systems  Constitutional: Negative.  Negative for chills, fever, malaise/fatigue and weight loss.  HENT:  Positive for congestion. Negative for ear discharge, ear pain and sinus pain.   Eyes:  Negative for pain, discharge and redness.  Respiratory:  Negative for cough, sputum production, shortness of breath and wheezing.   Cardiovascular: Negative.  Negative for chest pain and palpitations.  Gastrointestinal:  Negative for abdominal pain, constipation, diarrhea, heartburn, nausea and vomiting.  Skin: Negative.  Negative for itching and rash.  Neurological:  Negative for dizziness and headaches.  Endo/Heme/Allergies:  Positive for environmental allergies. Does not bruise/bleed easily.       Objective:   Blood pressure 112/60, pulse 70, temperature 98.3 F (36.8 C), temperature source Temporal, resp. rate 12, weight 150 lb 9.6 oz (68.3 kg), SpO2 98 %. Body mass index is 23.59 kg/m.    Physical Exam Vitals reviewed.  Constitutional:      Appearance: She is well-developed.     Comments: Pleasant female.  HENT:     Head: Normocephalic and atraumatic.     Right Ear: Tympanic membrane, ear canal and external ear normal.     Left Ear: Tympanic membrane, ear canal and external ear normal.     Ears:     Comments: Scarring of the TMs bilaterally.    Nose: No nasal deformity, septal deviation, mucosal edema or rhinorrhea.     Right Turbinates: Enlarged, swollen and pale.     Left Turbinates: Enlarged, swollen and pale.     Right Sinus: No maxillary sinus tenderness or frontal sinus tenderness.     Left Sinus: No maxillary sinus tenderness or frontal sinus tenderness.     Mouth/Throat:     Lips: Pink.     Mouth: Mucous membranes are moist. Mucous membranes are not pale and not dry.     Pharynx: Uvula  midline.  Eyes:     General: Lids are normal. Allergic shiner present.        Right eye: No discharge.        Left eye: No discharge.     Conjunctiva/sclera: Conjunctivae normal.     Right eye: Right conjunctiva is not injected. No chemosis.    Left eye: Left conjunctiva is not injected. No chemosis.    Pupils: Pupils are equal, round, and reactive to light.  Cardiovascular:     Rate and Rhythm: Normal rate and regular rhythm.     Heart sounds: Normal heart sounds.  Pulmonary:     Effort: Pulmonary effort is normal. No tachypnea, accessory muscle usage or respiratory distress.     Breath sounds: Normal breath sounds. No wheezing,  rhonchi or rales.     Comments: Moving air well in all lung fields.  No increased work of breathing. Chest:     Chest wall: No tenderness.  Lymphadenopathy:     Cervical: No cervical adenopathy.  Skin:    Coloration: Skin is not pale.     Findings: No abrasion, erythema, petechiae or rash. Rash is not papular, urticarial or vesicular.  Neurological:     Mental Status: She is alert.  Psychiatric:        Behavior: Behavior is cooperative.      Diagnostic studies:    Spirometry: results normal (FEV1: 4.43/127%, FVC: 4.90/118%, FEV1/FVC: 90%).    Spirometry consistent with normal pattern.   Allergy Studies: none        Malachi Bonds, MD  [facility]

## 2022-05-11 NOTE — Patient Instructions (Addendum)
1. Moderate persistent asthma, uncomplicated - Lung testing looks AMAZING today.  - We are not going to make any changes today. - I am glad that the Trelegy is covered so well.  - Daily controller medication(s): Trelegy 100/62.5/25 one puff once daily + Singulair 10mg  daily - Prior to physical activity: ProAir 2 puffs 10-15 minutes before physical activity. - Rescue medications: ProAir 4 puffs every 4-6 hours as needed - Asthma control goals:  * Full participation in all desired activities (may need albuterol before activity) * Albuterol use two time or less a week on average (not counting use with activity) * Cough interfering with sleep two time or less a month * Oral steroids no more than once a year * No hospitalizations  2. Perennial allergic rhinitis - Stop the fluticasone and start Xhance one spray per nostril twice daily (sample provided). - Hopefully this will help with nasal congestion and whatnot.  - Continue with alternating antihistamines as you are doing.  - I think we can hold off on allergy shots.   3. Return in about 6 months (around 11/10/2022).    Please inform 11/12/2022 of any Emergency Department visits, hospitalizations, or changes in symptoms. Call us before going to the ED for breathing or allergy symptoms since we might be able to fit you in for a sick visit. Feel free to contact us anytime with any questions, problems, or concerns.  It was a pleasure to see you again today! Hope that wrist heals!   Websites that have reliable patient information: 1. American Academy of Asthma, Allergy, and Immunology: www.aaaai.org 2. Food Allergy Research and Education (FARE): foodallergy.org 3. Mothers of Asthmatics: http://www.asthmacommunitynetwork.org 4. American College of Allergy, Asthma, and Immunology: www.acaai.org   COVID-19 Vaccine Information can be found at: [place] For questions related to vaccine  distribution or appointments, please email [email or web address] or call [phone].     "Like" [phone] on Facebook and Instagram for our latest updates!       Make sure you are registered to vote! If you have moved or changed any of your contact information, you will need to get this updated before voting!  In some cases, you MAY be able to register to vote online: [place]

## 2022-05-12 ENCOUNTER — Other Ambulatory Visit (HOSPITAL_COMMUNITY): Payer: Self-pay

## 2022-05-17 ENCOUNTER — Other Ambulatory Visit (HOSPITAL_COMMUNITY): Payer: Self-pay

## 2022-05-17 MED ORDER — TRAZODONE HCL 50 MG PO TABS: 50.0000 mg | ORAL_TABLET | Freq: Every evening | ORAL | 7 refills | Status: DC

## 2022-05-17 MED ORDER — TRAZODONE HCL 50 MG PO TABS
50.0000 mg | ORAL_TABLET | Freq: Every evening | ORAL | 7 refills | Status: DC
  Filled 2022-05-17: qty 90, 30d supply, fill #0
  Filled 2022-06-21: qty 90, 30d supply, fill #1
  Filled 2022-07-20: qty 90, 30d supply, fill #2
  Filled 2022-08-24: qty 90, 30d supply, fill #3
  Filled 2022-09-20: qty 90, 30d supply, fill #4
  Filled 2022-10-27: qty 90, 30d supply, fill #5
  Filled 2022-12-03: qty 90, 30d supply, fill #6
  Filled 2022-12-31: qty 90, 30d supply, fill #7

## 2022-05-28 ENCOUNTER — Telehealth: Payer: Self-pay | Admitting: Family Medicine

## 2022-05-28 NOTE — Telephone Encounter (Signed)
See below, ok to move up?

## 2022-05-28 NOTE — Telephone Encounter (Signed)
VML Left for Patient to reschedule CPE before 07/23/22 ok'd by Dr. Durene Cal

## 2022-05-28 NOTE — Telephone Encounter (Signed)
See below

## 2022-05-28 NOTE — Telephone Encounter (Signed)
Patient is scheduled for Physical on 11/01/22, however, Patient states in order to keep her insurance premium from going up, Patient needs to have her Physical prior to 07/23/22. Please advise.

## 2022-05-28 NOTE — Telephone Encounter (Signed)
Yes thanks 

## 2022-06-01 ENCOUNTER — Other Ambulatory Visit (HOSPITAL_COMMUNITY): Payer: Self-pay

## 2022-06-10 ENCOUNTER — Other Ambulatory Visit: Payer: Self-pay | Admitting: Allergy & Immunology

## 2022-06-10 ENCOUNTER — Other Ambulatory Visit (HOSPITAL_COMMUNITY): Payer: Self-pay

## 2022-06-10 MED ORDER — TRELEGY ELLIPTA 100-62.5-25 MCG/ACT IN AEPB
INHALATION_SPRAY | RESPIRATORY_TRACT | 0 refills | Status: DC
  Filled 2022-06-10: qty 60, 30d supply, fill #0

## 2022-06-12 ENCOUNTER — Other Ambulatory Visit (HOSPITAL_COMMUNITY): Payer: Self-pay

## 2022-06-21 ENCOUNTER — Other Ambulatory Visit (HOSPITAL_COMMUNITY): Payer: Self-pay

## 2022-07-16 ENCOUNTER — Ambulatory Visit (INDEPENDENT_AMBULATORY_CARE_PROVIDER_SITE_OTHER): Payer: No Typology Code available for payment source | Admitting: Family Medicine

## 2022-07-16 ENCOUNTER — Encounter: Payer: Self-pay | Admitting: Family Medicine

## 2022-07-16 VITALS — BP 110/62 | HR 88 | Temp 98.5°F | Ht 67.0 in | Wt 152.8 lb

## 2022-07-16 DIAGNOSIS — Z0000 Encounter for general adult medical examination without abnormal findings: Secondary | ICD-10-CM

## 2022-07-16 DIAGNOSIS — E559 Vitamin D deficiency, unspecified: Secondary | ICD-10-CM | POA: Diagnosis not present

## 2022-07-16 DIAGNOSIS — Z13220 Encounter for screening for lipoid disorders: Secondary | ICD-10-CM | POA: Diagnosis not present

## 2022-07-16 DIAGNOSIS — Z130 Encounter for screening for diseases of the blood and blood-forming organs and certain disorders involving the immune mechanism: Secondary | ICD-10-CM

## 2022-07-16 LAB — CBC WITH DIFFERENTIAL/PLATELET
Basophils Absolute: 0.1 10*3/uL (ref 0.0–0.1)
Basophils Relative: 1.1 % (ref 0.0–3.0)
Eosinophils Absolute: 0.1 10*3/uL (ref 0.0–0.7)
Eosinophils Relative: 1.5 % (ref 0.0–5.0)
HCT: 40.5 % (ref 36.0–46.0)
Hemoglobin: 13.7 g/dL (ref 12.0–15.0)
Lymphocytes Relative: 30.4 % (ref 12.0–46.0)
Lymphs Abs: 1.7 10*3/uL (ref 0.7–4.0)
MCHC: 33.8 g/dL (ref 30.0–36.0)
MCV: 90.3 fl (ref 78.0–100.0)
Monocytes Absolute: 0.5 10*3/uL (ref 0.1–1.0)
Monocytes Relative: 8.9 % (ref 3.0–12.0)
Neutro Abs: 3.2 10*3/uL (ref 1.4–7.7)
Neutrophils Relative %: 58.1 % (ref 43.0–77.0)
Platelets: 228 10*3/uL (ref 150.0–400.0)
RBC: 4.49 Mil/uL (ref 3.87–5.11)
RDW: 13.2 % (ref 11.5–15.5)
WBC: 5.6 10*3/uL (ref 4.0–10.5)

## 2022-07-16 LAB — VITAMIN D 25 HYDROXY (VIT D DEFICIENCY, FRACTURES): VITD: 49.33 ng/mL (ref 30.00–100.00)

## 2022-07-16 LAB — COMPREHENSIVE METABOLIC PANEL
ALT: 11 U/L (ref 0–35)
AST: 13 U/L (ref 0–37)
Albumin: 4.7 g/dL (ref 3.5–5.2)
Alkaline Phosphatase: 46 U/L (ref 39–117)
BUN: 13 mg/dL (ref 6–23)
CO2: 27 mEq/L (ref 19–32)
Calcium: 9.7 mg/dL (ref 8.4–10.5)
Chloride: 104 mEq/L (ref 96–112)
Creatinine, Ser: 0.87 mg/dL (ref 0.40–1.20)
GFR: 89.93 mL/min (ref >60.00)
Glucose, Bld: 96 mg/dL (ref 70–99)
Potassium: 4.4 mEq/L (ref 3.5–5.1)
Sodium: 139 mEq/L (ref 135–145)
Total Bilirubin: 1 mg/dL (ref 0.2–1.2)
Total Protein: 7.2 g/dL (ref 6.0–8.3)

## 2022-07-16 LAB — LIPID PANEL
Cholesterol: 167 mg/dL (ref 0–200)
HDL: 69.5 mg/dL (ref >39.00)
LDL Cholesterol: 89 mg/dL (ref 0–99)
NonHDL: 97.1
Total CHOL/HDL Ratio: 2
Triglycerides: 40 mg/dL (ref 0.0–149.0)
VLDL: 8 mg/dL (ref 0.0–40.0)

## 2022-07-16 NOTE — Progress Notes (Signed)
Phone [phone]   Subjective:  Patient presents today for their annual physical. Chief complaint-noted.   See problem oriented charting- ROS- full  review of systems was completed and negative except for: allergies, tension in right side of face (has tended to have issues on right side- used to get massages on that side), rash perhaps a year intermittent- had lupus and autoimmune workup- triggers stress and heat and sometimes alcohol- does not want to try elimination diet- kenalog helps some - starts on right hand but can go to left. More common at work  The following were reviewed and entered/updated in epic: Past Medical History:  Diagnosis Date   Abnormal Pap smear of cervix 2020   ADHD (attention deficit hyperactivity disorder) 03/18/2015   Dr. Toy Care psychiatry. ? OCD taking Adderall XR 29m. May be changing due to anxiety    Dysmenorrhea 03/18/2015   Birth control- Junel improved this.  As of 03/18/15 in homosexual relationship and not active with males-trialing off. Will see if has regular periods. Concerned about libido.     Exercise-induced asthma    Generalized anxiety disorder 01/18/2014   Dr. [facility]iatry. [place] [place]. Xanax 0.58mBID prn.  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   Patient Active Problem List   Diagnosis Date Noted   Asthma 01/16/2016    Priority: Medium    Attention deficit disorder (ADD) in adult 03/18/2015    Priority: Medium    Dysmenorrhea 03/18/2015    Priority: Medium    Generalized anxiety disorder 01/18/2014    Priority: Medium    Allergic rhinitis 01/16/2016    Priority: Low   Moderate persistent asthma, uncomplicated 1258/07/9832 Seasonal and perennial allergic rhinitis 11/16/2020   Past Surgical History:  Procedure Laterality Date   ADENOIDECTOMY     ankle sugery     TYMPANOSTOMY     1 set    Family History  Problem Relation Age  of Onset   ADD / ADHD Mother    Allergic rhinitis Mother    Asthma Mother    Hypertension Father    Cancer Maternal Aunt        breast- late 4032s Cancer Maternal Grandmother        breast-late 40s    Medications- reviewed and updated Current Outpatient Medications  Medication Sig Dispense Refill   albuterol (VENTOLIN HFA) 108 (90 Base) MCG/ACT inhaler Use 2 puffs every four hours as needed for cough or wheeze.  May use 2 puffs 10-20 minutes prior to exercise. 1 Inhaler 2   amphetamine-dextroamphetamine (ADDERALL XR) 20 MG 24 hr capsule Take 2 capsules by mouth daily. 180 capsule 0   amphetamine-dextroamphetamine (ADDERALL XR) 20 MG 24 hr capsule Take 2 capsules by mouth every morning 180 capsule 0   amphetamine-dextroamphetamine (ADDERALL) 20 MG tablet Take 1 tablet by mouth once daily 90 tablet 0   amphetamine-dextroamphetamine (ADDERALL) 20 MG tablet Take 1 tablet by mouth daily 90 tablet 0   Ascorbic Acid (VITAMIN C PO) Take by mouth.     Ashwagandha 500 MG CAPS See admin instructions.     COLLAGEN PO Take by mouth.     cyclobenzaprine (FLEXERIL) 5 MG tablet Take 1 tablet (5 mg total) by mouth daily as needed for muscle spasms. 30 tablet 3   fexofenadine (ALLEGRA) 180 MG tablet Take 1 tablet (180 mg total) by mouth daily. 30 tablet  5   fluticasone (FLONASE) 50 MCG/ACT nasal spray Place 1-2 sprays into both nostrils 2 (two) times a day. During the worst times of the year. 16 g 5   Fluticasone-Umeclidin-Vilant (TRELEGY ELLIPTA) 100-62.5-25 MCG/ACT AEPB Inhale 1 puff into the lungs daily. 60 each 0   HYDROcodone-acetaminophen (NORCO/VICODIN) 5-325 MG tablet Take 2 tablets by mouth every 4 (four) hours as needed. 6 tablet 0   montelukast (SINGULAIR) 10 MG tablet TAKE 1 TABLET BY MOUTH EVERY NIGHT AT BEDTIME 90 tablet 0   Multiple Vitamins-Minerals (ZINC PO) Take by mouth.     ondansetron (ZOFRAN-ODT) 8 MG disintegrating tablet Take 1 tablet (8 mg total) by mouth every 8 (eight) hours as  needed for nausea or vomiting. 20 tablet 0   Probiotic TBEC See admin instructions.     triamcinolone ointment (KENALOG) 0.1 % Apply 1 application topically 2 (two) times daily. 30 g 5   VITAMIN D PO Take by mouth daily.     cephALEXin (KEFLEX) 500 MG capsule Take 1 capsule (500 mg total) by mouth 4 (four) times daily. 20 capsule 0   traZODone (DESYREL) 50 MG tablet Take 1-3 tablets (50-150 mg total) by mouth at bedtime. (Patient not taking: Reported on 07/16/2022) 90 tablet 7   No current facility-administered medications for this visit.    Allergies-reviewed and updated No Known Allergies  Social History   Social History Narrative   Mar[place] august 2020 on the beach at [place].        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       Hobbies: sewing class 2018   Objective  Objective:  BP 110/62   Pulse 88   Temp 98.5 F (36.9 C)   Ht '5\' 7"'  (1.702 m)   Wt 152 lb 12.8 oz (69.3 kg)   SpO2 98%   BMI 23.93 kg/m  Gen: NAD, resting comfortably HEENT: Mucous membranes are moist. Oropharynx normal. Some scarring on bilateral TM Neck: no thyromegaly CV: RRR no murmurs rubs or gallops Lungs: CTAB no crackles, wheeze, rhonchi Abdomen: soft/nontender/nondistended/normal bowel sounds. No rebound or guarding.  Ext: no edema Skin: warm, dry Neuro: grossly normal, moves all extremities, PERRLA   Assessment and Plan   31 y.o. female presenting for annual physical.  Health Maintenance counseling: 1. Anticipatory guidance: Patient counseled regarding regular dental exams -q6 months, eye exams - yearly,  avoiding smoking and second hand smoke , limiting alcohol to 1 beverage per day , no illicit drugs.   2. Risk factor reduction:  Advised patient of need for regular exercise and diet rich and fruits and vegetables to reduce risk of heart attack  and stroke.  Exercise- active with work - biking regularly in AM stationary.  Diet/weight management-healthy weight- eats reasonably healthy.  Wt Readings from Last 3 Encounters:  07/16/22 152 lb 12.8 oz (69.3 kg)  05/11/22 150 lb 9.6 oz (68.3 kg)  11/10/21 153 lb 3.2 oz (69.5 kg)  3. Immunizations/screenings/ancillary studies- flu shot - will get at work Immunization History  Administered Date(s) Administered   Hepatitis B, adult 10/11/2013, 11/09/2013, 04/16/2014   Hpv-Unspecified 04/04/2006, 12/10/2008, 04/11/2009   Influenza,inj,Quad PF,6+ Mos 09/25/2013   Influenza-Unspecified 09/07/2015, 09/02/2017, 08/17/2018, 08/25/2020   MMR 10/11/2013   PFIZER(Purple Top)SARS-COV-2 Vaccination 11/14/2019, 12/05/2019, 10/02/2020   PPD Test  09/25/2013, 10/22/2013, 03/18/2015, 04/16/2015, 04/09/2016   Tdap 09/27/2013, 12/12/2021   Varicella 03/18/2015  4. 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 Breast cancer screening-  breast exam with GYN and mammogram - will start likely yearly at 56. Maternal aunt and grandma in late 75s- baseline age 85 6. Colon cancer screening - no family history, start at age 3 7. Skin cancer screening- saw dermatology recently. advised regular sunscreen use. Denies worrisome, changing, or new skin lesions.  8. Birth control/STD check- family planning, only active with spouse - no std screen 9. Osteoporosis screening at 35- plan later 10. Smoking associated screening - never smoker  Status of chronic or acute concerns   #GAD?- may be more situational anxiety/ADD- managed by Dr. Toy Care- on adderall 20 mg XR, Feels controlled. Trazodone for sleep -patient original diagnosis was GAD here before underlying ADD uncovered- has worked with psychiatry and no longer on medication for GAD- has xanax for travel related and acute stressors but rare use- she is going to discuss with Dr. Toy Care and we will update  diagnosis after that discussion.   #asthma/allergies- managed by Dr. Ernst Bowler- on albuterol prn, allegra, flonase, trelegy, singulair with reasonable control in past (trialed off and no worsening)  #used hld for labs last year but LDL over 100  (over 70) and certainly not in range for meds- will order under screening hyperlipidemia  Recommended follow up: Return in about 1 year (around 07/17/2023) for physical or sooner if needed.Schedule b4 you leave. Future Appointments  Date Time Provider [place]  11/01/2022 11:00 AM Garnet Sierras, DO [facility] None   Lab/Order associations: fasting   ICD-10-CM   1. Preventative health care  Z00.00       No orders of the defined types were placed in this encounter.   Return precautions advised.  Garret Reddish, MD

## 2022-07-16 NOTE — Patient Instructions (Addendum)
Let us know when you get flu and covid shots- I declined in system for now  Please stop by lab before you go If you have mychart- we will send your results within 3 business days of [place] receiving them.  If you do not have mychart- we will call you about results within 5 business days of [place] receiving them.  *please also note that you will see labs on mychart as soon as they post. I will later go in and write notes on them- will say "notes from Dr. Durene Cal"   Recommended follow up: Return in about 1 year (around 07/17/2023) for physical or sooner if needed.Schedule b4 you leave.

## 2022-07-20 ENCOUNTER — Other Ambulatory Visit (HOSPITAL_COMMUNITY): Payer: Self-pay

## 2022-07-22 ENCOUNTER — Other Ambulatory Visit: Payer: Self-pay | Admitting: Allergy & Immunology

## 2022-07-22 ENCOUNTER — Other Ambulatory Visit (HOSPITAL_COMMUNITY): Payer: Self-pay

## 2022-07-22 MED ORDER — TRELEGY ELLIPTA 100-62.5-25 MCG/ACT IN AEPB
INHALATION_SPRAY | RESPIRATORY_TRACT | 5 refills | Status: DC
  Filled 2022-07-22: qty 60, 30d supply, fill #0
  Filled 2022-09-20: qty 60, 30d supply, fill #1

## 2022-07-25 IMAGING — DX DG FINGER THUMB 2+V*R*
1 series · 3 of 3 positions shown · non-contrast
Comparison: None.

CLINICAL DATA: Laceration

EXAM:
RIGHT THUMB 2+V

[Series 1: finger · 0.14mm/px · 3 of 3 slices shown]
[im 1/3]
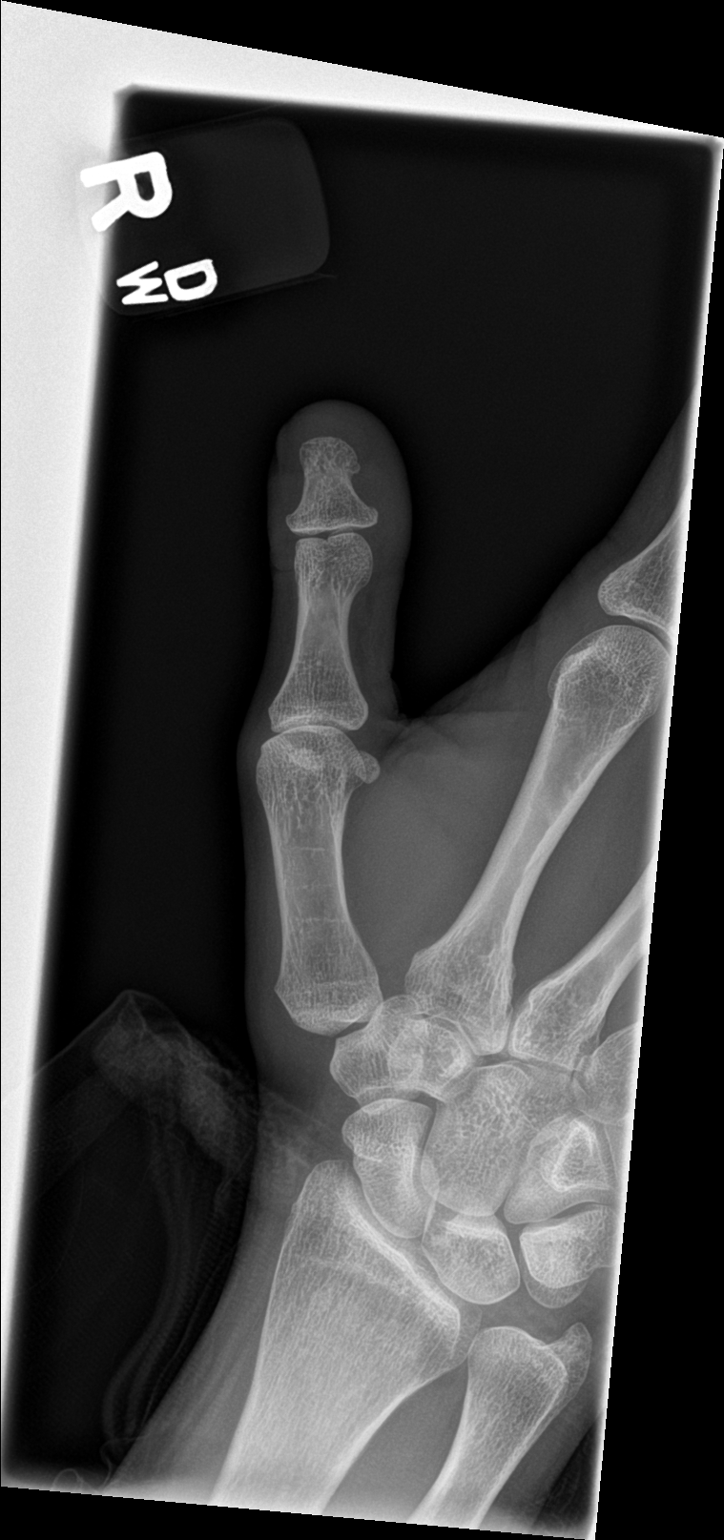
[im 2/3]
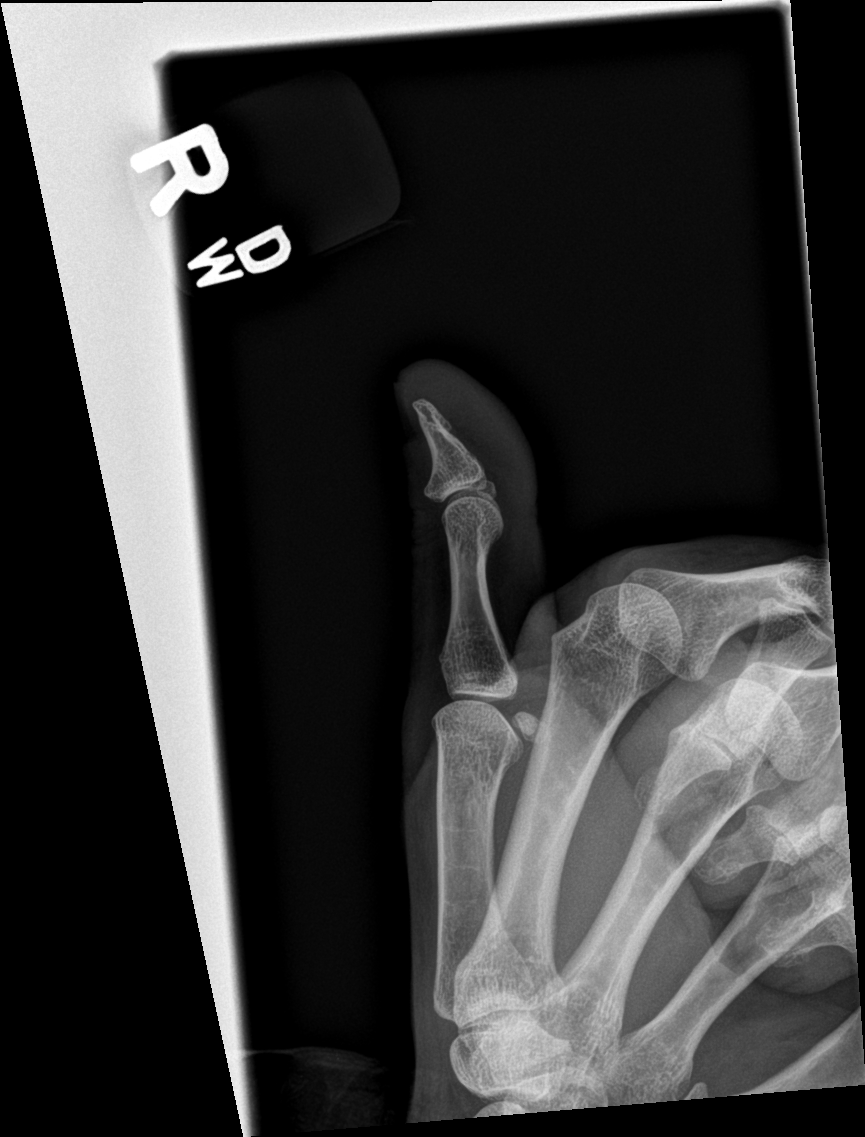
[im 3/3]
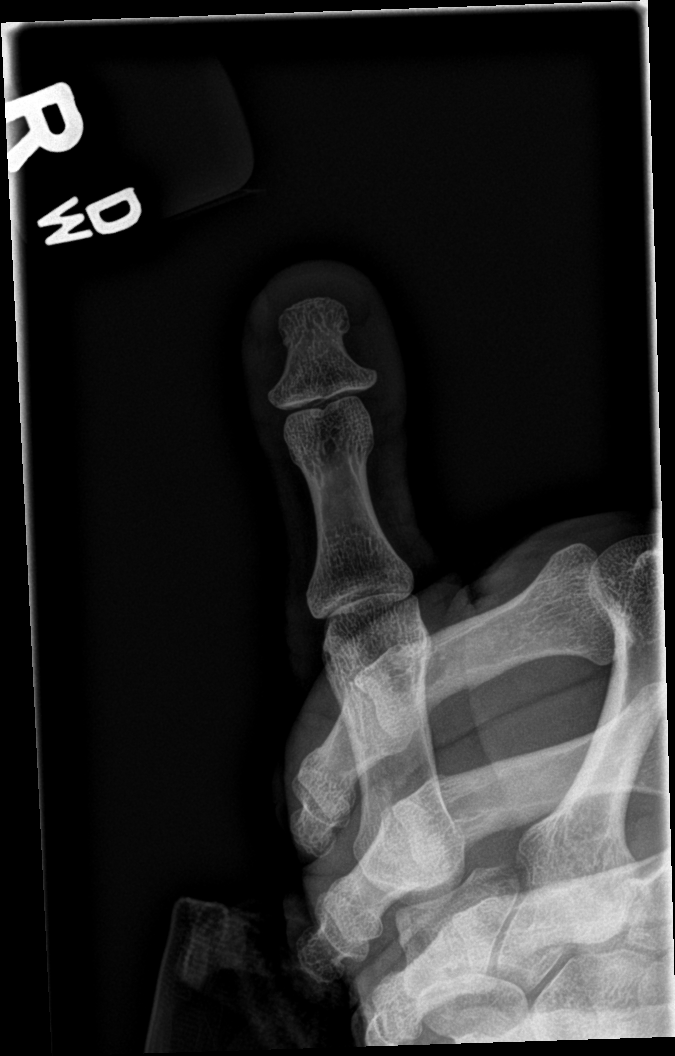

[3 of 3 positions shown; findings below may reference images not displayed]

FINDINGS: There is no evidence of fracture or dislocation. There is no
evidence of arthropathy or other focal bone abnormality. Soft
tissues are unremarkable.
IMPRESSION: Negative.

## 2022-08-24 ENCOUNTER — Other Ambulatory Visit (HOSPITAL_COMMUNITY): Payer: Self-pay

## 2022-09-20 ENCOUNTER — Other Ambulatory Visit (HOSPITAL_COMMUNITY): Payer: Self-pay

## 2022-09-30 ENCOUNTER — Other Ambulatory Visit (HOSPITAL_COMMUNITY): Payer: Self-pay

## 2022-10-01 ENCOUNTER — Other Ambulatory Visit (HOSPITAL_COMMUNITY): Payer: Self-pay

## 2022-10-01 MED ORDER — AMPHETAMINE-DEXTROAMPHETAMINE 20 MG PO TABS
20.0000 mg | ORAL_TABLET | Freq: Every day | ORAL | 0 refills | Status: DC
  Filled 2022-10-01: qty 90, 90d supply, fill #0

## 2022-10-12 ENCOUNTER — Other Ambulatory Visit (HOSPITAL_COMMUNITY): Payer: Self-pay

## 2022-10-26 ENCOUNTER — Ambulatory Visit: Payer: No Typology Code available for payment source | Admitting: Allergy & Immunology

## 2022-10-27 ENCOUNTER — Other Ambulatory Visit (HOSPITAL_COMMUNITY): Payer: Self-pay

## 2022-11-01 ENCOUNTER — Other Ambulatory Visit (HOSPITAL_COMMUNITY): Payer: Self-pay

## 2022-11-01 ENCOUNTER — Ambulatory Visit: Payer: No Typology Code available for payment source | Admitting: Allergy

## 2022-11-01 ENCOUNTER — Encounter: Payer: No Typology Code available for payment source | Admitting: Family Medicine

## 2022-11-08 ENCOUNTER — Other Ambulatory Visit: Payer: Self-pay

## 2022-11-08 ENCOUNTER — Encounter: Payer: Self-pay | Admitting: Family Medicine

## 2022-11-08 DIAGNOSIS — J4540 Moderate persistent asthma, uncomplicated: Secondary | ICD-10-CM

## 2022-11-08 DIAGNOSIS — J309 Allergic rhinitis, unspecified: Secondary | ICD-10-CM

## 2022-11-09 ENCOUNTER — Other Ambulatory Visit (HOSPITAL_BASED_OUTPATIENT_CLINIC_OR_DEPARTMENT_OTHER): Payer: Self-pay

## 2022-11-09 ENCOUNTER — Other Ambulatory Visit: Payer: Self-pay

## 2022-11-09 ENCOUNTER — Ambulatory Visit (INDEPENDENT_AMBULATORY_CARE_PROVIDER_SITE_OTHER): Payer: No Typology Code available for payment source | Admitting: Allergy & Immunology

## 2022-11-09 ENCOUNTER — Encounter: Payer: Self-pay | Admitting: Allergy & Immunology

## 2022-11-09 VITALS — BP 116/72 | HR 86 | Temp 98.6°F | Resp 16 | Ht 67.0 in | Wt 161.5 lb

## 2022-11-09 DIAGNOSIS — J3089 Other allergic rhinitis: Secondary | ICD-10-CM | POA: Diagnosis not present

## 2022-11-09 DIAGNOSIS — J4540 Moderate persistent asthma, uncomplicated: Secondary | ICD-10-CM | POA: Diagnosis not present

## 2022-11-09 DIAGNOSIS — R21 Rash and other nonspecific skin eruption: Secondary | ICD-10-CM | POA: Diagnosis not present

## 2022-11-09 DIAGNOSIS — J302 Other seasonal allergic rhinitis: Secondary | ICD-10-CM

## 2022-11-09 MED ORDER — TRELEGY ELLIPTA 100-62.5-25 MCG/ACT IN AEPB
INHALATION_SPRAY | RESPIRATORY_TRACT | 5 refills | Status: DC
Start: 2022-11-09 — End: 2023-02-26
  Filled 2022-11-09: qty 60, 60d supply, fill #0
  Filled 2023-01-18: qty 60, 30d supply, fill #1
  Filled 2023-02-26: qty 60, 30d supply, fill #2

## 2022-11-09 MED ORDER — ALBUTEROL SULFATE HFA 108 (90 BASE) MCG/ACT IN AERS
INHALATION_SPRAY | RESPIRATORY_TRACT | 1 refills | Status: DC
Start: 2022-11-09 — End: 2024-09-24
  Filled 2022-11-09: qty 6.7, 17d supply, fill #0

## 2022-11-09 NOTE — Patient Instructions (Addendum)
1. Moderate persistent asthma, uncomplicated - Lung testing not done today.  - Stop the montelukast since you did not notice a difference. - Daily controller medication(s): Trelegy 100/62.5/25 one puff once daily  - Prior to physical activity: ProAir 2 puffs 10-15 minutes before physical activity. - Rescue medications: ProAir 4 puffs every 4-6 hours as needed - Asthma control goals:  * Full participation in all desired activities (may need albuterol before activity) * Albuterol use two time or less a week on average (not counting use with activity) * Cough interfering with sleep two time or less a month * Oral steroids no more than once a year * No hospitalizations  2. Perennial allergic rhinitis - Continue with fluticasone one spray per nostril day.  - Hopefully this will help with nasal congestion and whatnot.  - Continue with alternating antihistamines as you are doing.  - We are going to retest environmental allergies.   3. Come back Thursday December 28th for environmental allergy testing.    Please inform us of any Emergency Department visits, hospitalizations, or changes in symptoms. Call us before going to the ED for breathing or allergy symptoms since we might be able to fit you in for a sick visit. Feel free to contact us anytime with any questions, problems, or concerns.  It was a pleasure to see you again today!   Websites that have reliable patient information: 1. American Academy of Asthma, Allergy, and Immunology: www.aaaai.org 2. Food Allergy Research and Education (FARE): foodallergy.org 3. Mothers of Asthmatics: http://www.asthmacommunitynetwork.org 4. American College of Allergy, Asthma, and Immunology: www.acaai.org   COVID-19 Vaccine Information can be found at: [email or web address].nl For questions related to vaccine distribution or appointments, please email [email or web address] or call [phone].      "Like" [place] on Facebook and Instagram for our latest updates!       Make sure you are registered to vote! If you have moved or changed any of your contact information, you will need to get this updated before voting!  In some cases, you MAY be able to register to vote online: [email or web address]

## 2022-11-09 NOTE — Progress Notes (Unsigned)
FOLLOW UP  Date of Service/Encounter:  11/09/22   Assessment:   Moderate persistent asthma without complication - now doing well on Trelegy (and more compliant since it is more affordable)   Seasonal and perennial allergic rhinitis (grasses, weeds, indoor molds, outdoor molds, dust mite)    Fully vaccinated and boosted to [employer] worker   Rash of hands - on extensor surface, but lasts only 1-3 hours (occurs 1-2 times per week)  Plan/Recommendations:   1. Moderate persistent asthma, uncomplicated - Lung testing not done today.  - Stop the montelukast since you did not notice a difference. - Daily controller medication(s): Trelegy 100/62.5/25 one puff once daily  - Prior to physical activity: ProAir 2 puffs 10-15 minutes before physical activity. - Rescue medications: ProAir 4 puffs every 4-6 hours as needed - Asthma control goals:  * Full participation in all desired activities (may need albuterol before activity) * Albuterol use two time or less a week on average (not counting use with activity) * Cough interfering with sleep two time or less a month * Oral steroids no more than once a year * No hospitalizations  2. Perennial allergic rhinitis - Continue with fluticasone one spray per nostril day.  - Hopefully this will help with nasal congestion and whatnot.  - Continue with alternating antihistamines as you are doing.  - We are going to retest environmental allergies.   3. Come back Thursday December 28th for environmental allergy testing.   Subjective:   Katherine Wise is a 31 y.o. female presenting today for follow up of  Chief Complaint  Patient presents with   ASTHMA   Follow-up    Katherine Wise has a history of the following: Patient Active Problem List   Diagnosis Date Noted   Moderate persistent asthma, uncomplicated 0000000   Seasonal and perennial allergic rhinitis 11/16/2020   Asthma 01/16/2016   Allergic rhinitis 01/16/2016    Attention deficit disorder (ADD) in adult 03/18/2015   Dysmenorrhea 03/18/2015   Generalized anxiety disorder 01/18/2014    History obtained from: chart review and patient.  Katherine Wise is a 31 y.o. female presenting for a follow up visit.  We last saw her in June 2023.  At that time, her lung testing looked amazing.  We did not make any changes.  We continue Trelegy 100 mcg 1 puff daily as well as Singulair 10 mg daily.  For her rhinitis, we stopped the Flonase and started Xhance 1 spray per nostril twice daily.  Since last visit, she has done well. 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 She likes the set schedule and using some different skill sets.   Asthma/Respiratory Symptom History: She is doing well from a breathing perspective. She thinks that the chest pain was likely related to anxiety. She never worked at [employer] (the [place] hospital). She remains on the Trelegy which is working well for her symptoms. She has the coupon and it is $0 out of pocket.  She has not been on prednisone since the last visit. She is on day 8 post COVID #4. She thinks that delta was the worst.   Allergic Rhinitis Symptom History: Allergies are under decent control. She was on [place] which was never really covered. She is not using nasal sprays. She is on Allegra (changed from Claritin recently). This seems to be doing better.  She has not been on antibiotics  at all, but she did contract COVID19 for the 4th time recently.   Skin Symptom History: The weird rash thing is less frequent but she never figured it out. It seemed to be related to thinks that she ingests. She can never find anything to correlate it. She has been  some topical treatments for it. We have previously worked her up for autoimmune disease including dermatomyositis since it appeared to be a heliotrope rash. But this has not been revealing at all.     Otherwise, there have been no changes to her past medical history, surgical history, family history, or social history.    Review of Systems  Constitutional: Negative.  Negative for chills, fever, malaise/fatigue and weight loss.  HENT:  Positive for congestion. Negative for ear discharge, ear pain and sinus pain.   Eyes:  Negative for pain, discharge and redness.  Respiratory:  Negative for cough, sputum production, shortness of breath and wheezing.   Cardiovascular: Negative.  Negative for chest pain and palpitations.  Gastrointestinal:  Negative for abdominal pain, constipation, diarrhea, heartburn, nausea and vomiting.  Skin: Negative.  Negative for itching and rash.  Neurological:  Negative for dizziness and headaches.  Endo/Heme/Allergies:  Positive for environmental allergies. Does not bruise/bleed easily.       Objective:   Blood pressure 116/72, pulse 86, temperature 98.6 F (37 C), temperature source Temporal, resp. rate 16, height 5\' 7"  (1.702 m), weight 161 lb 8 oz (73.3 kg), SpO2 97 %. Body mass index is 25.29 kg/m.    Physical Exam Vitals reviewed.  Constitutional:      Appearance: She is well-developed.  HENT:     Head: Normocephalic and atraumatic.     Right Ear: Tympanic membrane, ear canal and external ear normal. No drainage, swelling or tenderness. Tympanic membrane is not injected, scarred, erythematous, retracted or bulging.     Left Ear: Tympanic membrane, ear canal and external ear normal. No drainage, swelling or tenderness. Tympanic membrane is not injected, scarred, erythematous, retracted or bulging.     Nose: No nasal deformity, septal deviation, mucosal edema or rhinorrhea.     Right Turbinates: Enlarged, swollen and pale.     Left Turbinates: Enlarged, swollen and pale.     Right Sinus: No maxillary sinus tenderness or frontal sinus tenderness.     Left Sinus: No maxillary sinus tenderness or frontal sinus tenderness.     Mouth/Throat:      Mouth: Mucous membranes are not pale and not dry.     Pharynx: Uvula midline.  Eyes:     General:        Right eye: No discharge.        Left eye: No discharge.     Conjunctiva/sclera: Conjunctivae normal.     Right eye: Right conjunctiva is not injected. No chemosis.    Left eye: Left conjunctiva is not injected. No chemosis.    Pupils: Pupils are equal, round, and reactive to light.  Cardiovascular:     Rate and Rhythm: Normal rate and regular rhythm.     Heart sounds: Normal heart sounds.  Pulmonary:     Effort: Pulmonary effort is normal. No tachypnea, accessory muscle usage or respiratory distress.     Breath sounds: Normal breath sounds. No wheezing, rhonchi or rales.  Chest:     Chest wall: No tenderness.  Abdominal:     Tenderness: There is no abdominal tenderness. There is no guarding or rebound.  Lymphadenopathy:     Head:  Right side of head: No submandibular, tonsillar or occipital adenopathy.     Left side of head: No submandibular, tonsillar or occipital adenopathy.     Cervical: No cervical adenopathy.  Skin:    Coloration: Skin is not pale.     Findings: No abrasion, erythema, petechiae or rash. Rash is not papular, urticarial or vesicular.  Neurological:     Mental Status: She is alert.  Psychiatric:        Behavior: Behavior is cooperative.      Diagnostic studies: none        Salvatore Marvel, MD  [facility]

## 2022-11-10 ENCOUNTER — Encounter: Payer: Self-pay | Admitting: Allergy & Immunology

## 2022-11-10 ENCOUNTER — Other Ambulatory Visit (HOSPITAL_BASED_OUTPATIENT_CLINIC_OR_DEPARTMENT_OTHER): Payer: Self-pay

## 2022-11-18 ENCOUNTER — Ambulatory Visit (INDEPENDENT_AMBULATORY_CARE_PROVIDER_SITE_OTHER): Payer: No Typology Code available for payment source | Admitting: Allergy & Immunology

## 2022-11-18 ENCOUNTER — Other Ambulatory Visit: Payer: Self-pay

## 2022-11-18 DIAGNOSIS — J302 Other seasonal allergic rhinitis: Secondary | ICD-10-CM

## 2022-11-18 DIAGNOSIS — J3089 Other allergic rhinitis: Secondary | ICD-10-CM | POA: Diagnosis not present

## 2022-11-18 NOTE — Progress Notes (Signed)
FOLLOW UP  Date of Service/Encounter:  11/18/22   Assessment:   Moderate persistent asthma without complication - now doing well on Trelegy (and more compliant since it is more affordable)   Seasonal and perennial allergic rhinitis (indoor molds, outdoor molds, dust mites, cat, and cockroach)    Fully vaccinated and boosted to [employer] worker   Rash of hands - on extensor surface, but lasts only 1-3 hours (occurs 1-2 times per week)  Plan/Recommendations:   1. Moderate persistent asthma, uncomplicated - Lung testing not done today.  - Stop the montelukast since you did not notice a difference. - Daily controller medication(s): Trelegy 100/62.5/25 one puff once daily  - Prior to physical activity: ProAir 2 puffs 10-15 minutes before physical activity. - Rescue medications: ProAir 4 puffs every 4-6 hours as needed - Asthma control goals:  * Full participation in all desired activities (may need albuterol before activity) * Albuterol use two time or less a week on average (not counting use with activity) * Cough interfering with sleep two time or less a month * Oral steroids no more than once a year * No hospitalizations  2. Perennial allergic rhinitis - Testing today showed: indoor molds, outdoor molds, dust mites, cat, and cockroach. - Copy of test results provided.  - Avoidance measures provided. - I agree with stopping everything to see how you do. - You can always restart them if needed  - You can use an extra dose of the antihistamine, if needed, for breakthrough symptoms.  - Consider nasal saline rinses 1-2 times daily to remove allergens from the nasal cavities as well as help with mucous clearance (this is especially helpful to do before the nasal sprays are given) - Consider allergy shots as a means of long-term control. - Allergy shots "re-train" and "reset" the immune system to ignore environmental allergens and decrease the resulting immune response  to those allergens (sneezing, itchy watery eyes, runny nose, nasal congestion, etc).    - Allergy shots improve symptoms in 75-85% of patients.  - We can hold off on allergy shots for now.   3. Return in about 6 months (around 05/20/2023).    Subjective:   Katherine Wise is a 31 y.o. female presenting today for follow up of  Chief Complaint  Patient presents with   Allergy Testing    Katherine Wise has a history of the following: Patient Active Problem List   Diagnosis Date Noted   Moderate persistent asthma, uncomplicated 11/16/2020   Seasonal and perennial allergic rhinitis 11/16/2020   Asthma 01/16/2016   Allergic rhinitis 01/16/2016   Attention deficit disorder (ADD) in adult 03/18/2015   Dysmenorrhea 03/18/2015   Generalized anxiety disorder 01/18/2014    History obtained from: chart review and patient.  Katherine Wise is a 31 y.o. female presenting for skin testing. She was last seen in earlier in December. At that time, her lung function seems to be under good control.  We continued her on the Trelegy 1 puff once daily.  She was not 100% compliant with this, but she gets it at least 4-5 times per week.  She has not been needing her rescue inhaler.  We have started her on montelukast, but she never felt an improvement with that.  Therefore we stopped it.  For her rhinitis, we continue with Flonase.  She was also interested in retesting her environmental allergens because her allergy formula excellent control.  Since last visit, she has advised him to  stop her antihistamines and interestingly enough she actually never felt any worse without them.  She states that she might just plan to see how she does.  Otherwise, there have been no changes to her past medical history, surgical history, family history, or social history.    Review of Systems  Constitutional: Negative.  Negative for chills, fever, malaise/fatigue and weight loss.  HENT:  Positive for congestion. Negative for ear  discharge, ear pain and sinus pain.   Eyes:  Negative for pain, discharge and redness.  Respiratory:  Negative for cough, sputum production, shortness of breath and wheezing.   Cardiovascular: Negative.  Negative for chest pain and palpitations.  Gastrointestinal:  Negative for abdominal pain, constipation, diarrhea, heartburn, nausea and vomiting.  Skin: Negative.  Negative for itching and rash.  Neurological:  Negative for dizziness and headaches.  Endo/Heme/Allergies:  Positive for environmental allergies. Does not bruise/bleed easily.       Objective:   There were no vitals taken for this visit. There is no height or weight on file to calculate BMI.   Physical exam: deferred since this was a skin testing appointment only    Diagnostic studies:    Allergy Studies:     Airborne Adult Perc - 11/18/22 1654     Time Antigen Placed 1654    Allergen Manufacturer Waynette Buttery    Location Back    Number of Test 59    Panel 1 Select    1. Control-Buffer 50% Glycerol Negative    2. Control-Histamine 1 mg/ml 2+    3. Albumin saline Negative    4. Bahia Negative    5. French Southern Territories Negative    6. Johnson Negative    7. Kentucky Blue Negative    8. Meadow Fescue Negative    9. Perennial Rye Negative    10. Sweet Vernal Negative    11. Timothy Negative    12. Cocklebur Negative    13. Burweed Marshelder Negative    14. Ragweed, short Negative    15. Ragweed, Giant Negative    16. Plantain,  English Negative    17. Lamb's Quarters Negative    18. Sheep Sorrell Negative    19. Rough Pigweed Negative    20. Marsh Elder, Rough Negative    21. Mugwort, Common Negative    22. Ash mix Negative    23. Birch mix Negative    24. Beech American Negative    25. Box, Elder Negative    26. Cedar, red Negative    27. Cottonwood, Guinea-Bissau Negative    28. Elm mix Negative    29. Hickory Negative    30. Maple mix Negative    31. Oak, [place] mix Negative    32. Pecan Pollen Negative    33.  Pine mix Negative    34. Sycamore Eastern Negative    35. Walnut, Black Pollen Negative    36. Alternaria alternata Negative    37. Cladosporium Herbarum Negative    38. Aspergillus mix Negative    39. Penicillium mix Negative    40. Bipolaris sorokiniana (Helminthosporium) Negative    41. Drechslera spicifera (Curvularia) Negative    42. Mucor plumbeus Negative    43. Fusarium moniliforme Negative    44. Aureobasidium pullulans (pullulara) Negative    45. Rhizopus oryzae Negative    46. Botrytis cinera Negative    47. Epicoccum nigrum Negative    48. Phoma betae Negative    49. Candida Albicans Negative    50. Trichophyton  mentagrophytes Negative    51. Mite, D Farinae  5,000 AU/ml Negative    52. Mite, D Pteronyssinus  5,000 AU/ml Negative    53. Cat Hair 10,000 BAU/ml Negative    54.  Dog Epithelia Negative    55. Mixed Feathers Negative    56. Horse Epithelia Negative    57. Cockroach, German Negative    58. Mouse Negative    59. Tobacco Leaf Negative             Intradermal - 11/18/22 1728     Time Antigen Placed 1728    Allergen Manufacturer Waynette Buttery    Location Arm    Number of Test 15    Intradermal Select    Control Negative    French Southern Territories Negative    Johnson Negative    7 Grass Negative    Ragweed mix Negative    Weed mix Negative    Tree mix Negative    Mold 1 1+    Mold 2 2+    Mold 3 2+    Mold 4 2+    Cat 3+    Dog Negative    Cockroach 2+    Mite mix 3+             Allergy testing results were read and interpreted by myself, documented by clinical staff.      Malachi Bonds, MD  [facility]

## 2022-11-18 NOTE — Patient Instructions (Addendum)
1. Moderate persistent asthma, uncomplicated - Lung testing not done today.  - Stop the montelukast since you did not notice a difference. - Daily controller medication(s): Trelegy 100/62.5/25 one puff once daily  - Prior to physical activity: ProAir 2 puffs 10-15 minutes before physical activity. - Rescue medications: ProAir 4 puffs every 4-6 hours as needed - Asthma control goals:  * Full participation in all desired activities (may need albuterol before activity) * Albuterol use two time or less a week on average (not counting use with activity) * Cough interfering with sleep two time or less a month * Oral steroids no more than once a year * No hospitalizations  2. Perennial allergic rhinitis - Testing today showed: indoor molds, outdoor molds, dust mites, cat, and cockroach. - Copy of test results provided.  - Avoidance measures provided. - I agree with stopping everything to see how you do. - You can always restart them if needed  - You can use an extra dose of the antihistamine, if needed, for breakthrough symptoms.  - Consider nasal saline rinses 1-2 times daily to remove allergens from the nasal cavities as well as help with mucous clearance (this is especially helpful to do before the nasal sprays are given) - Consider allergy shots as a means of long-term control. - Allergy shots "re-train" and "reset" the immune system to ignore environmental allergens and decrease the resulting immune response to those allergens (sneezing, itchy watery eyes, runny nose, nasal congestion, etc).    - Allergy shots improve symptoms in 75-85% of patients.  - We can hold off on allergy shots for now.   3. Return in about 6 months (around 05/20/2023).    Please inform us of any Emergency Department visits, hospitalizations, or changes in symptoms. Call us before going to the ED for breathing or allergy symptoms since we might be able to fit you in for a sick visit. Feel free to contact us anytime  with any questions, problems, or concerns.  It was a pleasure to see you again today!  Websites that have reliable patient information: 1. American Academy of Asthma, Allergy, and Immunology: www.aaaai.org 2. Food Allergy Research and Education (FARE): foodallergy.org 3. Mothers of Asthmatics: http://www.asthmacommunitynetwork.org 4. American College of Allergy, Asthma, and Immunology: www.acaai.org   COVID-19 Vaccine Information can be found at: [email or web address] For questions related to vaccine distribution or appointments, please email [email or web address] or call [phone].   We realize that you might be concerned about having an allergic reaction to the COVID19 vaccines. To help with that concern, WE ARE OFFERING THE COVID19 VACCINES IN OUR OFFICE! Ask the front desk for dates!     "Like" [place] on Facebook and Instagram for our latest updates!      A healthy democracy works best when [employer] participate! Make sure you are registered to vote! If you have moved or changed any of your contact information, you will need to get this updated before voting!  In some cases, you MAY be able to register to vote online: [email or web address]          Airborne Adult Perc - 11/18/22 1654     Time Antigen Placed 1654    Allergen Manufacturer Waynette Buttery    Location Back    Number of Test 59    Panel 1 Select    1. Control-Buffer 50% Glycerol Negative    2. Control-Histamine 1 mg/ml 2+    3. Albumin saline Negative    4. Bahia Negative    5.  [place] Negative    6. Johnson Negative    7. Kentucky Blue Negative    8. Meadow Fescue Negative    9. Perennial Rye Negative    10. Sweet Vernal Negative    11. Timothy Negative    12. Cocklebur Negative    13. Burweed Marshelder Negative    14. Ragweed, short Negative    15. Ragweed, Giant Negative    16. Plantain,  English Negative    17. Lamb's  Quarters Negative    18. Sheep Sorrell Negative    19. Rough Pigweed Negative    20. Marsh Elder, Rough Negative    21. Mugwort, Common Negative    22. Ash mix Negative    23. Birch mix Negative    24. Beech American Negative    25. Box, Elder Negative    26. Cedar, red Negative    27. Cottonwood, Guinea-[place]issau Negative    28. Elm mix Negative    29. Hickory Negative    30. Maple mix Negative    31. Oak, Guinea-[place] mix Negative    32. Pecan Pollen Negative    33. Pine mix Negative    34. Sycamore Eastern Negative    35. Walnut, Black Pollen Negative    36. Alternaria alternata Negative    37. Cladosporium Herbarum Negative    38. Aspergillus mix Negative    39. Penicillium mix Negative    40. Bipolaris sorokiniana (Helminthosporium) Negative    41. Drechslera spicifera (Curvularia) Negative    42. Mucor plumbeus Negative    43. Fusarium moniliforme Negative    44. Aureobasidium pullulans (pullulara) Negative    45. Rhizopus oryzae Negative    46. Botrytis cinera Negative    47. Epicoccum nigrum Negative    48. Phoma betae Negative    49. Candida Albicans Negative    50. Trichophyton mentagrophytes Negative    51. Mite, D Farinae  5,000 AU/ml Negative    52. Mite, D Pteronyssinus  5,000 AU/ml Negative    53. Cat Hair 10,000 BAU/ml Negative    54.  Dog Epithelia Negative    55. Mixed Feathers Negative    56. Horse Epithelia Negative    57. Cockroach, German Negative    58. Mouse Negative    59. Tobacco Leaf Negative             Intradermal - 11/18/22 1728     Time Antigen Placed 1728    Allergen Manufacturer Waynette Buttery    Location Arm    Number of Test 15    Intradermal Select    Control Negative    [place] Territ[place] Negative    Johnson Negative    7 Grass Negative    Ragweed mix Negative    Weed mix Negative    Tree mix Negative    Mold 1 1+    Mold 2 2+    Mold 3 2+    Mold 4 2+    Cat 3+    Dog Negative    Cockroach 2+    Mite mix 3+             Control of Mold  Allergen   Mold and fungi can grow on a variety of surfaces provided certain temperature and moisture conditions exist.  Outdoor molds grow on plants, decaying vegetation and soil.  The major outdoor mold, Alternaria and Cladosporium, are found in very high numbers during hot and dry conditions.  Generally, a late Summer - Fall peak is seen  for common outdoor fungal spores.  Rain will temporarily lower outdoor mold spore count, but counts rise rapidly when the rainy period ends.  The most important indoor molds are Aspergillus and Penicillium.  Dark, humid and poorly ventilated basements are ideal sites for mold growth.  The next most common sites of mold growth are the bathroom and the kitchen.  Outdoor (Seasonal) Mold Control  Positive outdoor molds via skin testing: Alternaria, Cladosporium, Bipolaris (Helminthsporium), Drechslera (Curvalaria), and Mucor  Use air conditioning and keep windows closed Avoid exposure to decaying vegetation. Avoid leaf raking. Avoid grain handling. Consider wearing a face mask if working in moldy areas.    Indoor (Perennial) Mold Control   Positive indoor molds via skin testing: Aspergillus, Penicillium, Fusarium, Aureobasidium (Pullulara), and Rhizopus  Maintain humidity below 50%. Clean washable surfaces with 5% bleach solution. Remove sources e.g. contaminated carpets.    Control of Dust Mite Allergen    Dust mites play a major role in allergic asthma and rhinitis.  They occur in environments with high humidity wherever human skin is found.  Dust mites absorb humidity from the atmosphere (ie, they do not drink) and feed on organic matter (including shed human and animal skin).  Dust mites are a microscopic type of insect that you cannot see with the naked eye.  High levels of dust mites have been detected from mattresses, pillows, carpets, upholstered furniture, bed covers, clothes, soft toys and any woven material.  The principal allergen of the dust  mite is found in its feces.  A gram of dust may contain 1,000 mites and 250,000 fecal particles.  Mite antigen is easily measured in the air during house cleaning activities.  Dust mites do not bite and do not cause harm to humans, other than by triggering allergies/asthma.    Ways to decrease your exposure to dust mites in your home:  Encase mattresses, box springs and pillows with a mite-impermeable barrier or cover   Wash sheets, blankets and drapes weekly in hot water (130 F) with detergent and dry them in a dryer on the hot setting.  Have the room cleaned frequently with a vacuum cleaner and a damp dust-mop.  For carpeting or rugs, vacuuming with a vacuum cleaner equipped with a high-efficiency particulate air (HEPA) filter.  The dust mite allergic individual should not be in a room which is being cleaned and should wait 1 hour after cleaning before going into the room. Do not sleep on upholstered furniture (eg, couches).   If possible removing carpeting, upholstered furniture and drapery from the home is ideal.  Horizontal blinds should be eliminated in the rooms where the person spends the most time (bedroom, study, television room).  Washable vinyl, roller-type shades are optimal. Remove all non-washable stuffed toys from the bedroom.  Wash stuffed toys weekly like sheets and blankets above.   Reduce indoor humidity to less than 50%.  Inexpensive humidity monitors can be purchased at most hardware stores.  Do not use a humidifier as can make the problem worse and are not recommended.  Control of Cockroach Allergen  Cockroach allergen has been identified as an important cause of acute attacks of asthma, especially in urban settings.  There are fifty-five species of cockroach that exist in the [place], however only three, the Tun[place], GuineaGerman and Oriental species produce allergen that can affect patients with Asthma.  Allergens can be obtained from fecal particles, egg casings and  secretions from cockroaches.    Remove food sources. Reduce access  to water. Seal access and entry points. Spray runways with 0.5-1% Diazinon or Chlorpyrifos Blow boric acid power under stoves and refrigerator. Place bait stations (hydramethylnon) at feeding sites.  Control of Dog or Cat Allergen  Avoidance is the best way to manage a dog or cat allergy. If you have a dog or cat and are allergic to dog or cats, consider removing the dog or cat from the home. If you have a dog or cat but don't want to find it a new home, or if your family wants a pet even though someone in the household is allergic, here are some strategies that may help keep symptoms at bay:  Keep the pet out of your bedroom and restrict it to only a few rooms. Be advised that keeping the dog or cat in only one room will not limit the allergens to that room. Don't pet, hug or kiss the dog or cat; if you do, wash your hands with soap and water. High-efficiency particulate air (HEPA) cleaners run continuously in a bedroom or living room can reduce allergen levels over time. Regular use of a high-efficiency vacuum cleaner or a central vacuum can reduce allergen levels. Giving your dog or cat a bath at least once a week can reduce airborne allergen.

## 2022-11-19 ENCOUNTER — Encounter: Payer: Self-pay | Admitting: Allergy & Immunology

## 2022-11-24 ENCOUNTER — Other Ambulatory Visit (HOSPITAL_BASED_OUTPATIENT_CLINIC_OR_DEPARTMENT_OTHER): Payer: Self-pay

## 2022-11-30 ENCOUNTER — Other Ambulatory Visit (HOSPITAL_BASED_OUTPATIENT_CLINIC_OR_DEPARTMENT_OTHER): Payer: Self-pay

## 2022-12-03 ENCOUNTER — Other Ambulatory Visit (HOSPITAL_BASED_OUTPATIENT_CLINIC_OR_DEPARTMENT_OTHER): Payer: Self-pay

## 2022-12-31 ENCOUNTER — Other Ambulatory Visit (HOSPITAL_BASED_OUTPATIENT_CLINIC_OR_DEPARTMENT_OTHER): Payer: Self-pay

## 2022-12-31 MED ORDER — TRAZODONE HCL 50 MG PO TABS
50.0000 mg | ORAL_TABLET | Freq: Every day | ORAL | 1 refills | Status: DC
  Filled 2022-12-31 (×2): qty 270, 90d supply, fill #0
  Filled 2023-04-04: qty 270, 90d supply, fill #1

## 2023-01-18 ENCOUNTER — Other Ambulatory Visit: Payer: Self-pay

## 2023-01-18 ENCOUNTER — Other Ambulatory Visit (HOSPITAL_BASED_OUTPATIENT_CLINIC_OR_DEPARTMENT_OTHER): Payer: Self-pay

## 2023-01-19 ENCOUNTER — Encounter: Payer: Self-pay | Admitting: Allergy & Immunology

## 2023-01-20 ENCOUNTER — Other Ambulatory Visit (HOSPITAL_BASED_OUTPATIENT_CLINIC_OR_DEPARTMENT_OTHER): Payer: Self-pay

## 2023-01-20 ENCOUNTER — Other Ambulatory Visit (HOSPITAL_COMMUNITY): Payer: Self-pay

## 2023-02-21 ENCOUNTER — Other Ambulatory Visit (HOSPITAL_BASED_OUTPATIENT_CLINIC_OR_DEPARTMENT_OTHER): Payer: Self-pay

## 2023-02-22 ENCOUNTER — Other Ambulatory Visit (HOSPITAL_BASED_OUTPATIENT_CLINIC_OR_DEPARTMENT_OTHER): Payer: Self-pay

## 2023-02-22 MED ORDER — AMPHETAMINE-DEXTROAMPHET ER 20 MG PO CP24
40.0000 mg | ORAL_CAPSULE | Freq: Every morning | ORAL | 0 refills | Status: DC
  Filled 2023-02-22: qty 180, 90d supply, fill #0

## 2023-02-26 ENCOUNTER — Other Ambulatory Visit: Payer: Self-pay | Admitting: Allergy & Immunology

## 2023-02-26 ENCOUNTER — Other Ambulatory Visit (HOSPITAL_BASED_OUTPATIENT_CLINIC_OR_DEPARTMENT_OTHER): Payer: Self-pay

## 2023-02-28 ENCOUNTER — Other Ambulatory Visit (HOSPITAL_BASED_OUTPATIENT_CLINIC_OR_DEPARTMENT_OTHER): Payer: Self-pay

## 2023-02-28 MED ORDER — TRELEGY ELLIPTA 100-62.5-25 MCG/ACT IN AEPB
INHALATION_SPRAY | RESPIRATORY_TRACT | 1 refills | Status: DC
  Filled 2023-02-28: qty 60, 30d supply, fill #0
  Filled 2023-04-08: qty 60, 30d supply, fill #1

## 2023-05-26 ENCOUNTER — Other Ambulatory Visit: Payer: Self-pay | Admitting: Allergy & Immunology

## 2023-05-27 ENCOUNTER — Other Ambulatory Visit (HOSPITAL_BASED_OUTPATIENT_CLINIC_OR_DEPARTMENT_OTHER): Payer: Self-pay

## 2023-05-31 ENCOUNTER — Other Ambulatory Visit (HOSPITAL_BASED_OUTPATIENT_CLINIC_OR_DEPARTMENT_OTHER): Payer: Self-pay

## 2023-06-01 ENCOUNTER — Other Ambulatory Visit (HOSPITAL_BASED_OUTPATIENT_CLINIC_OR_DEPARTMENT_OTHER): Payer: Self-pay

## 2023-06-01 ENCOUNTER — Other Ambulatory Visit: Payer: Self-pay | Admitting: Allergy & Immunology

## 2023-06-02 ENCOUNTER — Encounter: Payer: Self-pay | Admitting: Allergy & Immunology

## 2023-06-02 NOTE — Telephone Encounter (Signed)
Is it ok to send in? You had put 6 months on your AVS from the last visit.

## 2023-06-07 ENCOUNTER — Other Ambulatory Visit (HOSPITAL_COMMUNITY): Payer: Self-pay

## 2023-06-07 ENCOUNTER — Other Ambulatory Visit: Payer: Self-pay

## 2023-06-07 MED ORDER — TRELEGY ELLIPTA 100-62.5-25 MCG/ACT IN AEPB
1.0000 | INHALATION_SPRAY | Freq: Every day | RESPIRATORY_TRACT | 5 refills | Status: DC
  Filled 2023-06-07 (×2): qty 60, 30d supply, fill #0
  Filled 2023-07-06: qty 60, 30d supply, fill #1
  Filled 2023-08-17: qty 60, 30d supply, fill #2
  Filled 2023-10-02: qty 60, 30d supply, fill #3

## 2023-06-07 NOTE — Telephone Encounter (Signed)
Prescription sent in  

## 2023-06-10 ENCOUNTER — Other Ambulatory Visit (HOSPITAL_BASED_OUTPATIENT_CLINIC_OR_DEPARTMENT_OTHER): Payer: Self-pay

## 2023-07-01 DIAGNOSIS — F900 Attention-deficit hyperactivity disorder, predominantly inattentive type: Secondary | ICD-10-CM | POA: Diagnosis not present

## 2023-07-01 DIAGNOSIS — F5101 Primary insomnia: Secondary | ICD-10-CM | POA: Diagnosis not present

## 2023-07-06 ENCOUNTER — Other Ambulatory Visit (HOSPITAL_BASED_OUTPATIENT_CLINIC_OR_DEPARTMENT_OTHER): Payer: Self-pay

## 2023-07-07 ENCOUNTER — Encounter (INDEPENDENT_AMBULATORY_CARE_PROVIDER_SITE_OTHER): Payer: Self-pay

## 2023-07-07 ENCOUNTER — Other Ambulatory Visit (HOSPITAL_BASED_OUTPATIENT_CLINIC_OR_DEPARTMENT_OTHER): Payer: Self-pay

## 2023-07-07 ENCOUNTER — Other Ambulatory Visit: Payer: Self-pay

## 2023-07-07 MED ORDER — TRAZODONE HCL 50 MG PO TABS
50.0000 mg | ORAL_TABLET | Freq: Every day | ORAL | 2 refills | Status: DC
  Filled 2023-07-07: qty 270, 90d supply, fill #0
  Filled 2023-10-12: qty 270, 90d supply, fill #1
  Filled 2024-01-09: qty 270, 90d supply, fill #2

## 2023-07-07 MED ORDER — AMPHETAMINE-DEXTROAMPHETAMINE 20 MG PO TABS
20.0000 mg | ORAL_TABLET | Freq: Every day | ORAL | 0 refills | Status: DC
  Filled 2023-07-07: qty 90, 90d supply, fill #0

## 2023-07-11 ENCOUNTER — Other Ambulatory Visit (HOSPITAL_BASED_OUTPATIENT_CLINIC_OR_DEPARTMENT_OTHER): Payer: Self-pay

## 2023-07-19 ENCOUNTER — Encounter: Payer: No Typology Code available for payment source | Admitting: Family Medicine

## 2023-07-26 ENCOUNTER — Ambulatory Visit (INDEPENDENT_AMBULATORY_CARE_PROVIDER_SITE_OTHER): Payer: 59 | Admitting: Family Medicine

## 2023-07-26 ENCOUNTER — Encounter: Payer: Self-pay | Admitting: Family Medicine

## 2023-07-26 VITALS — BP 130/80 | HR 85 | Temp 98.1°F | Ht 67.0 in | Wt 163.0 lb

## 2023-07-26 DIAGNOSIS — Z0000 Encounter for general adult medical examination without abnormal findings: Secondary | ICD-10-CM

## 2023-07-26 NOTE — Progress Notes (Signed)
Phone [phone]   Subjective:  Patient presents today for their annual physical. Chief complaint-noted.   See problem oriented charting- ROS- full  review of systems was completed and negative Per full ROS sheet completed by patient  The following were reviewed and entered/updated in epic: Past Medical History:  Diagnosis Date   Abnormal Pap smear of cervix 2020   ADHD (attention deficit hyperactivity disorder) 03/18/2015   Dr. Evelene Croon psychiatry. ? OCD taking Adderall XR 20mg . May be changing due to anxiety    Dysmenorrhea 03/18/2015   Birth control- Junel improved this.  As of 03/18/15 in homosexual relationship and not active with males-trialing off. Will see if has regular periods. Concerned about libido.     Exercise-induced asthma    Generalized anxiety disorder 01/18/2014   Dr. Evelene Croon psychiatry. Lawerance Bach for counseling. Xanax 0.5mg  BID prn.  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   Patient Active Problem List   Diagnosis Date Noted   Moderate persistent asthma, uncomplicated 11/16/2020    Priority: Medium    Seasonal and perennial allergic rhinitis 11/16/2020    Priority: Medium    Asthma 01/16/2016    Priority: Medium    Attention deficit disorder (ADD) in adult 03/18/2015    Priority: Medium    Dysmenorrhea 03/18/2015    Priority: Medium    Allergic rhinitis 01/16/2016    Priority: Low   Generalized anxiety disorder 01/18/2014    Priority: Low   Past Surgical History:  Procedure Laterality Date   ADENOIDECTOMY     ankle sugery     FRACTURE SURGERY     TYMPANOSTOMY     1 set    Family History  Problem Relation Age of Onset   ADD / ADHD Mother    Allergic rhinitis Mother    Asthma Mother    Hypertension Father    Cancer Maternal Aunt        breast- late 57s   Cancer Maternal Grandmother        breast-late 40s    Medications- reviewed and updated Current  Outpatient Medications  Medication Sig Dispense Refill   albuterol (VENTOLIN HFA) 108 (90 Base) MCG/ACT inhaler Use 2 puffs every four hours as needed for cough or wheeze.  May use 2 puffs 10-20 minutes prior to exercise. 6.7 g 1   amphetamine-dextroamphetamine (ADDERALL XR) 20 MG 24 hr capsule Take 2 capsules by mouth daily. 180 capsule 0   amphetamine-dextroamphetamine (ADDERALL XR) 20 MG 24 hr capsule Take 2 capsules by mouth every morning 180 capsule 0   amphetamine-dextroamphetamine (ADDERALL XR) 20 MG 24 hr capsule Take 2 capsules (40 mg total) by mouth every morning. 180 capsule 0   Ascorbic Acid (VITAMIN C PO) Take by mouth.     fexofenadine (ALLEGRA) 180 MG tablet Take 1 tablet (180 mg total) by mouth daily. 30 tablet 5   fluticasone (FLONASE) 50 MCG/ACT nasal spray Place 1-2 sprays into both nostrils 2 (two) times a day. During the worst times of the year. 16 g 5   Fluticasone-Umeclidin-Vilant (TRELEGY ELLIPTA) 100-62.5-25 MCG/ACT AEPB Inhale 1 puff into the lungs daily. 60 each 5   Probiotic TBEC See admin instructions.     traZODone (DESYREL) 50 MG tablet Take 1-3 tablets (50-150 mg total) by mouth at bedtime. 270 tablet 2   triamcinolone ointment (KENALOG) 0.1 % Apply 1 application topically 2 (two) times daily. 30 g 5  VITAMIN D PO Take by mouth daily.     ALPRAZolam (XANAX) 0.5 MG tablet Take 0.5 mg by mouth as needed. (Patient not taking: Reported on 07/26/2023)     No current facility-administered medications for this visit.    Allergies-reviewed and updated No Known Allergies  Social History   Social History Narrative   Mar[place] august 2020 on the beach at [place].  She reports that Dr. Durene Cal sees her husband.      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       Hobbies: enjoys formula 1 racing   Objective  Objective:  BP 130/80   Pulse 85   Temp 98.1 F (36.7 C)   Ht 5\' 7"  (1.702 m)   Wt 163 lb (73.9 kg)   SpO2 98%   BMI 25.53 kg/m  Gen: NAD, resting comfortably HEENT: Mucous membranes are moist. Oropharynx normal Neck: no thyromegaly CV: RRR no murmurs rubs or gallops Lungs: CTAB no crackles, wheeze, rhonchi Abdomen: soft/nontender/nondistended/normal bowel sounds. No rebound or guarding.  Ext: no edema Skin: warm, dry Neuro: grossly normal, moves all extremities, PERRLA   Assessment and Plan   32 y.o. female presenting for annual physical.  Health Maintenance counseling: 1. Anticipatory guidance: Patient counseled regarding regular dental exams -q6 months, eye exams - yearly,  avoiding smoking and second hand smoke , limiting alcohol to 1 beverage per day- 3 a week , no illicit drugs.   2. Risk factor reduction:  Advised patient of need for regular exercise and diet rich and fruits and vegetables to reduce risk of heart attack and stroke.  Exercise-activity with work less than previous, slight slump on exercise- wants to restart- in habit of going to bed later and waking up later and missing exercise- wants to start back running/walking. Less biking lately Diet/weight management-weight up 11 pounds from last year-activity lower plus just got back from beach- a lot of salty foods.  Wt Readings from Last 3 Encounters:  07/26/23 163 lb (73.9 kg)  11/09/22 161 lb 8 oz (73.3 kg)  07/16/22 152 lb 12.8 oz (69.3 kg)  3. Immunizations/screenings/ancillary studies-will get flu shot at work.  Holding off on COVID shot plus had it recently Immunization History  Administered Date(s) Administered   Hepatitis B, ADULT 10/11/2013, 11/09/2013, 04/16/2014   Hpv-Unspecified 04/04/2006, 12/10/2008, 04/11/2009   Influenza,inj,Quad PF,6+ Mos 09/25/2013   Influenza-Unspecified 09/07/2015, 09/02/2017, 08/17/2018, 08/25/2020    MMR 10/11/2013   PFIZER(Purple Top)SARS-COV-2 Vaccination 11/14/2019, 12/05/2019, 10/02/2020   PPD Test 09/25/2013, 10/22/2013, 03/18/2015, 04/16/2015, 04/09/2016   Tdap 09/27/2013, 12/12/2021   Varicella 03/18/2015  4. Cervical cancer screening-  [facility]- upcoming pap scheduled with history abnormal but looked good last year 5. Breast cancer screening-  breast exam with GYN and mammogram - will start likely yearly at 40. Maternal aunt and grandma in late 35s- baseline age 74 6. Colon cancer screening - no family history, start at age 70 although dad had polyps and she will check on age for him 7. Skin cancer screening- saw dermatology in last year advised regular sunscreen use. Denies worrisome, changing, or new skin lesions.  8. Birth control/STD check- family planning, only active with spouse -  no std screen 9. Osteoporosis screening at 65- plan later 10. Smoking associated screening - never smoker  Status of chronic or acute concerns   #social update- whole family at beach - just got back  #ADD-managed by Dr. Evelene Croon on Adderall 20 mg extended release with trazodone for sleep -Original diagnosis of GAD before ADD uncovered by psychiatry.  Does still keep Xanax for travel related anxiety and acute stressors- very rare event -may also add serotonin modulator more around hormone variations  # Asthma-along with allergies managed by Dr. Darron Doom albuterol as Annie Paras 100 mg daily, Flonase, Trelegy using coupon- stable  # Screening hyperlipidemia-just done last year-offered repeat- but opted for full labs next visit Lab Results  Component Value Date   CHOL 167 07/16/2022   HDL 69.50 07/16/2022   LDLCALC 89 07/16/2022   TRIG 40.0 07/16/2022   CHOLHDL 2 07/16/2022    Recommended follow up: - Return in about 1 year (around 07/25/2024) for physical or sooner if needed.Schedule b4 you leave. Future Appointments  Date Time Provider Department Center  11/01/2023  4:30 PM  Alfonse Spruce, MD [facility] None   Lab/Order associations: NOT fasting   ICD-10-CM   1. Routine general medical examination at a health care facility  Z00.00       No orders of the defined types were placed in this encounter.   Return precautions advised.  Tana Conch, MD

## 2023-07-26 NOTE — Patient Instructions (Addendum)
Let us know when you get your flu shot at your clinic.  Start back with your good bedtime tonight which will springboard you back into the regular exercise- running or walking  Labs next year since looked good last year and no concerns at this time  Recommended follow up: Return in about 1 year (around 07/25/2024) for physical or sooner if needed.Schedule b4 you leave.

## 2023-08-07 ENCOUNTER — Other Ambulatory Visit (HOSPITAL_BASED_OUTPATIENT_CLINIC_OR_DEPARTMENT_OTHER): Payer: Self-pay

## 2023-08-29 ENCOUNTER — Encounter: Payer: Self-pay | Admitting: Family Medicine

## 2023-09-27 DIAGNOSIS — Z01419 Encounter for gynecological examination (general) (routine) without abnormal findings: Secondary | ICD-10-CM | POA: Diagnosis not present

## 2023-09-27 DIAGNOSIS — G4700 Insomnia, unspecified: Secondary | ICD-10-CM | POA: Diagnosis not present

## 2023-09-27 DIAGNOSIS — Z124 Encounter for screening for malignant neoplasm of cervix: Secondary | ICD-10-CM | POA: Diagnosis not present

## 2023-10-12 ENCOUNTER — Other Ambulatory Visit (HOSPITAL_BASED_OUTPATIENT_CLINIC_OR_DEPARTMENT_OTHER): Payer: Self-pay

## 2023-10-12 ENCOUNTER — Other Ambulatory Visit: Payer: Self-pay

## 2023-10-12 MED ORDER — AMPHETAMINE-DEXTROAMPHET ER 20 MG PO CP24
40.0000 mg | ORAL_CAPSULE | Freq: Every morning | ORAL | 0 refills | Status: DC
  Filled 2023-10-12: qty 60, 30d supply, fill #0

## 2023-10-26 ENCOUNTER — Telehealth: Payer: 59 | Admitting: Physician Assistant

## 2023-10-26 ENCOUNTER — Ambulatory Visit (HOSPITAL_COMMUNITY)
Admission: EM | Admit: 2023-10-26 | Discharge: 2023-10-26 | Disposition: A | Discharge status: Home / Self Care | Payer: 59 | Attending: Emergency Medicine | Admitting: Emergency Medicine

## 2023-10-26 ENCOUNTER — Encounter (HOSPITAL_COMMUNITY): Payer: Self-pay

## 2023-10-26 DIAGNOSIS — K529 Noninfective gastroenteritis and colitis, unspecified: Secondary | ICD-10-CM

## 2023-10-26 DIAGNOSIS — R1030 Lower abdominal pain, unspecified: Secondary | ICD-10-CM | POA: Diagnosis not present

## 2023-10-26 DIAGNOSIS — R197 Diarrhea, unspecified: Secondary | ICD-10-CM

## 2023-10-26 MED ORDER — DICYCLOMINE HCL 20 MG PO TABS: 20.0000 mg | ORAL_TABLET | Freq: Two times a day (BID) | ORAL | 0 refills | Status: DC

## 2023-10-26 NOTE — ED Provider Notes (Addendum)
[facility]    CSN: [id number] Arrival date & time: 10/26/23  1720      History   Chief Complaint Chief Complaint  Patient presents with   Diarrhea   Abdominal Pain   Fever    HPI Katherine Wise is a 32 y.o. female.   Patient presents to clinic with her spouse.  She has had diarrhea, low-grade fever and lower abdominal pain and spasming since Monday.  She did have some nausea, this is since improved.  No vomiting. Abdominal pain is constant currently but feels like a tightening / spasm sensation while having bowel movements.   She has been taking Imodium.  She is also been having headaches which Excedrin has helped with.  She has become concerned because her last 2 bowel movements did have a red tinge to them.  She was drinking a red fruit punch Gatorade prior to this and is drinking a pink Gatorade in clinic.  Tolerating oral fluids.  Had a temperature of 100.8 last night and a low-grade temperature this morning.  No recent sick contacts.  Denies any cough, wheezing, shortness of breath or sore throat.  The history is provided by the patient, medical records and the spouse.  Diarrhea Abdominal Pain Fever   Past Medical History:  Diagnosis Date   Abnormal Pap smear of cervix 2020   ADHD (attention deficit hyperactivity disorder) 03/18/2015   Dr. Evelene Croon psychiatry. ? OCD taking Adderall XR 20mg . May be changing due to anxiety    Dysmenorrhea 03/18/2015   Birth control- Junel improved this.  As of 03/18/15 in homosexual relationship and not active with males-trialing off. Will see if has regular periods. Concerned about libido.     Exercise-induced asthma    Generalized anxiety disorder 01/18/2014   Dr. Evelene Croon psychiatry. Lawerance Bach for counseling. Xanax 0.5mg  BID prn.  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      Home Medications    Prior to Admission medications   Medication Sig Start Date End Date Taking? Authorizing Provider  dicyclomine (BENTYL) 20 MG tablet Take 1 tablet (20 mg total) by mouth 2 (two) times daily. 10/26/23  Yes Rinaldo Ratel, [place] N, FNP  albuterol (VENTOLIN HFA) 108 (90 Base) MCG/ACT inhaler Use 2 puffs every four hours as needed for cough or wheeze.  May use 2 puffs 10-20 minutes prior to exercise. 11/09/22   Alfonse Spruce, MD  ALPRAZolam Prudy Feeler) 0.5 MG tablet Take 0.5 mg by mouth as needed. Patient not taking: Reported on 07/26/2023    [provider]  amphetamine-dextroamphetamine (ADDERALL XR) 20 MG 24 hr capsule Take 2 capsules by mouth daily. 04/16/21     amphetamine-dextroamphetamine (ADDERALL XR) 20 MG 24 hr capsule Take 2 capsules by mouth every morning 01/08/22     amphetamine-dextroamphetamine (ADDERALL  XR) 20 MG 24 hr capsule Take 2 capsules (40 mg total) by mouth every morning. 02/22/23     amphetamine-dextroamphetamine (ADDERALL XR) 20 MG 24 hr capsule Take 2 capsules (40 mg total) by mouth in the morning. 10/12/23     Ascorbic Acid (VITAMIN C PO) Take by mouth.    [provider]  fexofenadine (ALLEGRA) 180 MG tablet Take 1 tablet (180 mg total) by mouth daily. 06/28/18   Alfonse Spruce, MD  fluticasone [facility]) 50 MCG/ACT nasal spray Place 1-2 sprays into both nostrils 2 (two) times a day. During the worst times of the year. 03/29/19   Alfonse Spruce, MD   Fluticasone-Umeclidin-Vilant (TRELEGY ELLIPTA) 100-62.5-25 MCG/ACT AEPB Inhale 1 puff into the lungs daily. 06/07/23   Alfonse Spruce, MD  Probiotic TBEC See admin instructions.    [provider]  traZODone (DESYREL) 50 MG tablet Take 1-3 tablets (50-150 mg total) by mouth at bedtime. 07/07/23     triamcinolone ointment (KENALOG) 0.1 % Apply 1 application topically 2 (two) times daily. 05/12/21   Alfonse Spruce, MD  VITAMIN D PO Take by mouth daily.    [provider]    Family History Family History  Problem Relation Age of Onset   ADD / ADHD Mother    Allergic rhinitis Mother    Asthma Mother    Hypertension Father    Cancer Maternal Aunt        breast- late 37s   Cancer Maternal Grandmother        breast-late 70s    Social History Social History   Tobacco Use   Smoking status: Never   Smokeless tobacco: Never  Vaping Use   Vaping status: Never Used  Substance Use Topics   Alcohol use: Yes    Alcohol/week: 3.0 standard drinks of alcohol    Types: 3 Standard drinks or equivalent per week    Comment: occasional   Drug use: No     Allergies   Patient has no known allergies.   Review of Systems Review of Systems  Per HPI   Physical Exam Triage Vital Signs ED Triage Vitals  Encounter Vitals Group     BP 10/26/23 1800 120/84     Systolic BP Percentile --      Diastolic BP Percentile --      Pulse Rate 10/26/23 1800 98     Resp 10/26/23 1800 18     Temp 10/26/23 1800 98.9 F (37.2 C)     Temp Source 10/26/23 1800 Oral     SpO2 10/26/23 1800 97 %     Weight 10/26/23 1758 165 lb (74.8 kg)     Height 10/26/23 1758 5\' 7"  (1.702 m)     Head Circumference --      Peak Flow --      Pain Score 10/26/23 1758 5     Pain Loc --      Pain Education --      Exclude from Growth Chart --    No data found.  Updated Vital Signs BP 120/84 (BP Location: Right Arm)   Pulse 98   Temp 98.9 F (37.2 C) (Oral)   Resp 18   Ht 5\' 7"  (1.702  m)   Wt 165 lb (74.8 kg)   LMP 10/20/2023 (Exact Date)   SpO2 97%   BMI 25.84 kg/m   Visual Acuity Right Eye Distance:   Left Eye Distance:   Bilateral Distance:    Right Eye  Near:   Left Eye Near:    Bilateral Near:     Physical Exam Vitals reviewed.  Constitutional:      Appearance: Normal appearance. She is well-developed.  HENT:     Head: Normocephalic and atraumatic.     Right Ear: External ear normal.     Left Ear: External ear normal.     Nose: Nose normal.     Mouth/Throat:     Mouth: Mucous membranes are moist.  Eyes:     General: No scleral icterus. Cardiovascular:     Rate and Rhythm: Normal rate.  Pulmonary:     Effort: Pulmonary effort is normal. No respiratory distress.  Abdominal:     General: Abdomen is flat. Bowel sounds are normal.     Palpations: Abdomen is soft.     Tenderness: There is abdominal tenderness in the right lower quadrant, suprapubic area and left lower quadrant. There is no guarding or rebound.  Musculoskeletal:        General: Normal range of motion.  Skin:    General: Skin is warm and dry.  Neurological:     General: No focal deficit present.     Mental Status: She is alert.  Psychiatric:        Mood and Affect: Mood normal.      UC Treatments / Results  Labs (all labs ordered are listed, but only abnormal results are displayed) Labs Reviewed - No data to display  EKG   Radiology No results found.  Procedures Procedures (including critical care time)  Medications Ordered in UC Medications - No data to display  Initial Impression / Assessment and Plan / UC Course  I have reviewed the triage vital signs and the nursing notes.  Pertinent labs & imaging results that were available during my care of the patient were reviewed by me and considered in my medical decision making (see chart for details).  Vitals and triage reviewed, patient is hemodynamically stable.  Abdomen is soft with active bowel sounds.  Does have  lower quadrant tenderness, no rebound or guarding.  Low concern for acute or surgical abdomen at this time.  Tolerating oral fluids, encouraged oral rehydration.  Symptoms are most likely viral in nature, symptomatic management encouraged.  Strict emergency precautions given if no improvement or any changes.  Will send in Bentyl for the abdominal spasming, that is made worse with bowel movements.  Plan of care, follow-up care and return precautions given, no questions at this time.     Final Clinical Impressions(s) / UC Diagnoses   Final diagnoses:  Diarrhea, unspecified type  Lower abdominal pain     Discharge Instructions      Please ensure you are staying well-hydrated with at least 64 ounces water daily.  You can add an electrolyte solution like Gatorade or liquid IV to this as well to rehydrate you for the fluids you have lost with your diarrhea.  You can take the Zofran as needed for nausea, this may cause constipation.  You also take the Bentyl twice daily as needed for any spasms or cramping.  Please follow a bland diet to help avoid further gastrointestinal irritation such as soup, toast, rice, applesauce and bananas.   Your symptoms should improve over the next few days.  If no improvement or any changes do not hesitate to seek immediate care at the nearest emergency department for further evaluation.     ED Prescriptions     Medication Sig Dispense Auth. Provider  dicyclomine (BENTYL) 20 MG tablet Take 1 tablet (20 mg total) by mouth 2 (two) times daily. 20 tablet Timia Casselman, [place], [place]      PDMP not reviewed this encounter.       Apolo Cutshaw, [place] N, [place] 10/26/23 [phone]

## 2023-10-26 NOTE — Progress Notes (Signed)
Because of frequency and severity of diarrhea despite use of loperamide at max dose, and febrile illness, I feel your condition warrants further evaluation and I recommend that you be seen in a face to face visit.     NOTE: There will be NO CHARGE for this eVisit   If you are having a true medical emergency please call 911.      For an urgent face to face visit, [place] has eight urgent care centers for your convenience:   NEW!! [facility] at [facility] Get Driving Directions [phone] [address] [place], [address]    [facility] at [facility] Get Driving Directions [phone] [address] [place], [place] [address]   [facility] Health Urgent Care Center [facility]) Get Driving Directions [phone] [address] [place], [place] [address]  [facility]gent Care Center [facility] [place]) Get Driving Directions [phone] [address] [place],  [place]  [address]  [facility] Health Urgent Care Center [facility] - at [employer]  [phone] [address] [place],  [place] [address]   [facility] Health Urgent Care at [facility] Get Driving Directions [phone] [address] [place], [place] [address]   [facility] Health Urgent Care at [facility] Get Driving Directions  [phone] [address] [place], [place] [address]   [facility] Health Urgent Care at [facility] Directions [phone] [address], [address] [place], [place] [address]  Your MyChart E-visit questionnaire answers were reviewed by a board certified advanced clinical practitioner to complete your personal care plan based on your specific symptoms.  Thank you for using e-Visits.

## 2023-10-26 NOTE — ED Triage Notes (Signed)
Pt presents with diarrhea, low grade fever, and lower abdominal pain at this time. Pt currently denies nausea/vomiting. Pt currently rates her pain as a 5/10. Pt states "I have maxed out on imodium and had to take Excedrin for my headache, there was improvement, however I do have concerns as right before we came my last two bowel movements had a red-tinge to it."

## 2023-10-26 NOTE — Discharge Instructions (Addendum)
Please ensure you are staying well-hydrated with at least 64 ounces water daily.  You can add an electrolyte solution like Gatorade or liquid IV to this as well to rehydrate you for the fluids you have lost with your diarrhea.  You can take the Zofran as needed for nausea, this may cause constipation.  You also take the Bentyl twice daily as needed for any spasms or cramping.  Please follow a bland diet to help avoid further gastrointestinal irritation such as soup, toast, rice, applesauce and bananas.   Your symptoms should improve over the next few days.  If no improvement or any changes do not hesitate to seek immediate care at the nearest emergency department for further evaluation.

## 2023-10-27 ENCOUNTER — Encounter (HOSPITAL_COMMUNITY): Payer: Self-pay

## 2023-10-27 ENCOUNTER — Other Ambulatory Visit: Payer: Self-pay

## 2023-10-27 ENCOUNTER — Emergency Department (HOSPITAL_COMMUNITY)
Admission: EM | Admit: 2023-10-27 | Discharge: 2023-10-27 | Disposition: A | Discharge status: Home / Self Care | Payer: 59 | Attending: Student | Admitting: Student

## 2023-10-27 ENCOUNTER — Other Ambulatory Visit (HOSPITAL_COMMUNITY): Payer: Self-pay

## 2023-10-27 ENCOUNTER — Encounter: Payer: Self-pay | Admitting: Family Medicine

## 2023-10-27 ENCOUNTER — Emergency Department (HOSPITAL_COMMUNITY): Payer: 59

## 2023-10-27 ENCOUNTER — Other Ambulatory Visit (HOSPITAL_BASED_OUTPATIENT_CLINIC_OR_DEPARTMENT_OTHER): Payer: Self-pay

## 2023-10-27 ENCOUNTER — Ambulatory Visit: Payer: 59 | Admitting: Internal Medicine

## 2023-10-27 ENCOUNTER — Encounter: Payer: Self-pay | Admitting: Internal Medicine

## 2023-10-27 VITALS — BP 121/86 | HR 109 | Temp 98.4°F | Wt 155.2 lb

## 2023-10-27 DIAGNOSIS — R109 Unspecified abdominal pain: Secondary | ICD-10-CM | POA: Diagnosis present

## 2023-10-27 DIAGNOSIS — E860 Dehydration: Secondary | ICD-10-CM

## 2023-10-27 DIAGNOSIS — E876 Hypokalemia: Secondary | ICD-10-CM | POA: Diagnosis not present

## 2023-10-27 DIAGNOSIS — Z7951 Long term (current) use of inhaled steroids: Secondary | ICD-10-CM | POA: Diagnosis not present

## 2023-10-27 DIAGNOSIS — R1031 Right lower quadrant pain: Secondary | ICD-10-CM | POA: Diagnosis not present

## 2023-10-27 DIAGNOSIS — R112 Nausea with vomiting, unspecified: Secondary | ICD-10-CM | POA: Diagnosis not present

## 2023-10-27 DIAGNOSIS — J45909 Unspecified asthma, uncomplicated: Secondary | ICD-10-CM | POA: Diagnosis not present

## 2023-10-27 DIAGNOSIS — R1030 Lower abdominal pain, unspecified: Secondary | ICD-10-CM

## 2023-10-27 DIAGNOSIS — N200 Calculus of kidney: Secondary | ICD-10-CM | POA: Diagnosis not present

## 2023-10-27 DIAGNOSIS — K529 Noninfective gastroenteritis and colitis, unspecified: Secondary | ICD-10-CM | POA: Diagnosis not present

## 2023-10-27 DIAGNOSIS — R197 Diarrhea, unspecified: Secondary | ICD-10-CM

## 2023-10-27 LAB — COMPREHENSIVE METABOLIC PANEL
ALT: 20 U/L (ref 0–44)
AST: 18 U/L (ref 15–41)
Albumin: 3.8 g/dL (ref 3.5–5.0)
Alkaline Phosphatase: 49 U/L (ref 38–126)
Anion gap: 10 (ref 5–15)
BUN: 6 mg/dL (ref 6–20)
CO2: 24 mmol/L (ref 22–32)
Calcium: 8.8 mg/dL — ABNORMAL LOW (ref 8.9–10.3)
Chloride: 101 mmol/L (ref 98–111)
Creatinine, Ser: 0.7 mg/dL (ref 0.44–1.00)
GFR, Estimated: >60 mL/min (ref >60)
Glucose, Bld: 112 mg/dL — ABNORMAL HIGH (ref 70–99)
Potassium: 3.3 mmol/L — ABNORMAL LOW (ref 3.5–5.1)
Sodium: 135 mmol/L (ref 135–145)
Total Bilirubin: 0.6 mg/dL (ref <1.2)
Total Protein: 6.7 g/dL (ref 6.5–8.1)

## 2023-10-27 LAB — URINALYSIS, ROUTINE W REFLEX MICROSCOPIC
Bilirubin Urine: NEGATIVE
Glucose, UA: NEGATIVE mg/dL
Hgb urine dipstick: NEGATIVE
Ketones, ur: NEGATIVE mg/dL
Leukocytes,Ua: NEGATIVE
Nitrite: NEGATIVE
Protein, ur: NEGATIVE mg/dL
Specific Gravity, Urine: 1.004 — ABNORMAL LOW (ref 1.005–1.030)
pH: 7 (ref 5.0–8.0)

## 2023-10-27 LAB — CBC
HCT: 41.8 % (ref 36.0–46.0)
Hemoglobin: 14.3 g/dL (ref 12.0–15.0)
MCH: 30 pg (ref 26.0–34.0)
MCHC: 34.2 g/dL (ref 30.0–36.0)
MCV: 87.6 fL (ref 80.0–100.0)
Platelets: 244 10*3/uL (ref 150–400)
RBC: 4.77 MIL/uL (ref 3.87–5.11)
RDW: 12.5 % (ref 11.5–15.5)
WBC: 4.2 10*3/uL (ref 4.0–10.5)
nRBC: 0 % (ref 0.0–0.2)

## 2023-10-27 LAB — HCG, SERUM, QUALITATIVE: Preg, Serum: NEGATIVE

## 2023-10-27 LAB — C DIFFICILE QUICK SCREEN W PCR REFLEX
C Diff antigen: NEGATIVE
C Diff interpretation: NOT DETECTED
C Diff toxin: NEGATIVE

## 2023-10-27 LAB — LIPASE, BLOOD: Lipase: 24 U/L (ref 11–51)

## 2023-10-27 MED ORDER — IOHEXOL 300 MG/ML  SOLN
100.0000 mL | Freq: Once | INTRAMUSCULAR | Status: AC | PRN
  Administered 2023-10-27: 100 mL via INTRAVENOUS

## 2023-10-27 MED ORDER — ONDANSETRON HCL 4 MG/2ML IJ SOLN
4.0000 mg | Freq: Once | INTRAMUSCULAR | Status: AC | PRN
  Administered 2023-10-27: 4 mg via INTRAVENOUS
  Filled 2023-10-27: qty 2

## 2023-10-27 MED ORDER — FENTANYL CITRATE PF 50 MCG/ML IJ SOSY
50.0000 ug | PREFILLED_SYRINGE | Freq: Once | INTRAMUSCULAR | Status: AC
  Administered 2023-10-27: 50 ug via INTRAVENOUS
  Filled 2023-10-27: qty 1

## 2023-10-27 MED ORDER — LACTATED RINGERS IV BOLUS
1000.0000 mL | Freq: Once | INTRAVENOUS | Status: AC
  Administered 2023-10-27: 1000 mL via INTRAVENOUS

## 2023-10-27 MED ORDER — AZITHROMYCIN 250 MG PO TABS
500.0000 mg | ORAL_TABLET | Freq: Every day | ORAL | 0 refills | Status: DC
  Filled 2023-10-27: qty 6, 3d supply, fill #0
  Filled 2023-10-27: qty 6, 5d supply, fill #0
  Filled 2023-10-27: qty 6, 3d supply, fill #0

## 2023-10-27 MED ORDER — MORPHINE SULFATE (PF) 4 MG/ML IV SOLN
4.0000 mg | Freq: Once | INTRAVENOUS | Status: AC
  Administered 2023-10-27: 4 mg via INTRAVENOUS
  Filled 2023-10-27: qty 1

## 2023-10-27 NOTE — Progress Notes (Signed)
[facility]: [phone]   -- Medical Office Visit --  Patient:  Katherine Wise      Age: 32 y.o.       Sex:  female  Date:   10/27/2023 Today's Healthcare Provider: Lula Olszewski, MD  ==========================================================================    Assessment & Plan Lower abdominal pain Abdominal Pain with Diarrhea   She presents with acute onset of severe lower abdominal pain and diarrhea since Monday, experiencing cramping, nausea, and a low-grade fever. The pain, localized below the umbilicus, eases slightly when sitting on the toilet. Despite the use of Imodium and Bentyl, there has been no relief. Given the severity and location of the pain, appendicitis is a significant concern, although the presentation is not typical of classic right lower quadrant pain. Another differential diagnosis considered is ovarian cyst rupture. Immediate evaluation is necessary to exclude appendicitis, which could lead to rupture and peritonitis if not addressed. We discussed the risks of delaying treatment, including potential rupture and peritonitis, alongside the benefits of immediate evaluation and possible surgery. An ER referral is advised for rapid lab results and imaging to aid in diagnosis. We will recommend morphine for pain management and a CT scan, advising against oral intake except for water. The possibility of IV antibiotics and potential surgery was discussed, noting that appendicitis might be managed non-surgically depending on CT findings. Diarrhea of presumed infectious origin  Nausea and vomiting, unspecified vomiting type  Dehydration Dehydration   She reports feeling dehydrated and exhausted, attributed to ongoing diarrhea and an inability to maintain adequate fluid intake. IV fluids at the ER are recommended to address dehydration. Follow-up   An immediate referral to the ER is made for evaluation and management, encompassing both  the abdominal pain with diarrhea and dehydration concerns.   SUBJECTIVE: 32 y.o. female who has Generalized anxiety disorder; Attention deficit disorder (ADD) in adult; Dysmenorrhea; Asthma; Allergic rhinitis; Moderate persistent asthma, uncomplicated; and Seasonal and perennial allergic rhinitis on their problem list.  Main reasons for visit/main concerns/chief complaint: Lower abdominal pain (Severe and started yesterday.), Diarrhea (All other symptoms since Monday. Has taken Immodium and ibuprofen with no relief. Also has taken Bentyl with no relief really (prescribed by urgent care)), Intermittent nausea, Low grade fever previously (Not currently. Was around 100.0.), and Follow-up (Went to urgent care last night.)   AI-Extracted: Discussed the use of AI scribe software for clinical note transcription with the patient, who gave verbal consent to proceed.  History of Present Illness   The patient, a nurse by profession, presents with a 4-day history of gastrointestinal symptoms that began with a sensation of bloating and progressed to diarrhea. The patient describes the stools as initially semi-solid, but later becoming completely liquid. Accompanying these symptoms, the patient experienced lower abdominal pain, which she describes as incredibly intense and located below the umbilicus. The pain is constant, with no specific position providing relief, and sometimes intensifies upon sitting on the toilet. The patient also reports episodes of low-grade fever.  Over the course of the illness, the patient attempted self-management with over-the-counter medications, including Imodium and Zofran, with no relief. An e-visit was conducted due to the severity of the pain, and the patient was advised to seek care at an urgent care facility. At the urgent care facility, the patient was prescribed Bentyl, which also did not provide relief. The patient reports feeling exhausted and dehydrated due to the ongoing  symptoms.  The patient denies any possibility of an  ectopic pregnancy, confirming no recent sexual activity and the presence of a uterus and ovaries. The patient also reports that hiccupping causes significant pain, more so on the right side of the abdomen. The patient's fingertips were noted to touch McBurney's point, a classic location for appendicitis pain.       Note that patient  has a past medical history of Abnormal Pap smear of cervix (2020), ADHD (attention deficit hyperactivity disorder) (03/18/2015), Dysmenorrhea (03/18/2015), Exercise-induced asthma, Generalized anxiety disorder (01/18/2014), and HPV in female (09/2020).  Problem list overviews that were updated at today's visit:No problems updated.  Med reconciliation: Current Outpatient Medications on File Prior to Visit  Medication Sig   albuterol (VENTOLIN HFA) 108 (90 Base) MCG/ACT inhaler Use 2 puffs every four hours as needed for cough or wheeze.  May use 2 puffs 10-20 minutes prior to exercise.   ALPRAZolam (XANAX) 0.5 MG tablet Take 0.5 mg by mouth as needed. (Patient not taking: Reported on 07/26/2023)   amphetamine-dextroamphetamine (ADDERALL XR) 20 MG 24 hr capsule Take 2 capsules by mouth daily.   amphetamine-dextroamphetamine (ADDERALL XR) 20 MG 24 hr capsule Take 2 capsules by mouth every morning   amphetamine-dextroamphetamine (ADDERALL XR) 20 MG 24 hr capsule Take 2 capsules (40 mg total) by mouth every morning.   amphetamine-dextroamphetamine (ADDERALL XR) 20 MG 24 hr capsule Take 2 capsules (40 mg total) by mouth in the morning.   Ascorbic Acid (VITAMIN C PO) Take by mouth.   dicyclomine (BENTYL) 20 MG tablet Take 1 tablet (20 mg total) by mouth 2 (two) times daily.   fexofenadine (ALLEGRA) 180 MG tablet Take 1 tablet (180 mg total) by mouth daily.   fluticasone (FLONASE) 50 MCG/ACT nasal spray Place 1-2 sprays into both nostrils 2 (two) times a day. During the worst times of the year.   Fluticasone-Umeclidin-Vilant  (TRELEGY ELLIPTA) 100-62.5-25 MCG/ACT AEPB Inhale 1 puff into the lungs daily.   Probiotic TBEC See admin instructions.   traZODone (DESYREL) 50 MG tablet Take 1-3 tablets (50-150 mg total) by mouth at bedtime.   triamcinolone ointment (KENALOG) 0.1 % Apply 1 application topically 2 (two) times daily.   VITAMIN D PO Take by mouth daily.   No current facility-administered medications on file prior to visit.  There are no discontinued medications.   Objective   Physical Exam     10/27/2023   11:34 AM 10/26/2023    6:00 PM 10/26/2023    5:58 PM  Vitals with BMI  Height   5\' 7"   Weight 155 lbs 3 oz  165 lbs  BMI 24.3  25.84  Systolic 121 120   Diastolic 86 84   Pulse 109 98    Wt Readings from Last 10 Encounters:  10/27/23 155 lb 3.2 oz (70.4 kg)  10/26/23 165 lb (74.8 kg)  07/26/23 163 lb (73.9 kg)  11/09/22 161 lb 8 oz (73.3 kg)  07/16/22 152 lb 12.8 oz (69.3 kg)  05/11/22 150 lb 9.6 oz (68.3 kg)  11/10/21 153 lb 3.2 oz (69.5 kg)  07/16/21 144 lb 6.4 oz (65.5 kg)  05/12/21 148 lb 6.4 oz (67.3 kg)  11/13/20 149 lb 12.8 oz (67.9 kg)   Vital signs reviewed.  Nursing notes reviewed. Weight trend reviewed. 10 pounds 1 day weight loss noted, tachycardia noted Abnormalities and Problem-Specific physical exam findings:  rebound pain and tenderness lower right mid abdomen slightly more central than mcburneys point   General Appearance:  No acute distress appreciable.   Well-groomed, ill-appearing female.  Well proportioned with no abnormal fat distribution.  Good muscle tone. Pulmonary:  Normal work of breathing at rest, no respiratory distress apparent. SpO2: 97 %  Musculoskeletal: All extremities are intact.  Neurological:  Awake, alert, oriented, and engaged.  No obvious focal neurological deficits or cognitive impairments.  Sensorium seems unclouded.   Speech is clear and coherent with logical content. Psychiatric:  Appropriate mood, pleasant and cooperative demeanor, thoughtful and  engaged during the exam  Results            No results found for any visits on 10/27/23.  No visits with results within 1 Year(s) from this visit.  Latest known visit with results is:  Office Visit on 07/16/2022  Component Date Value   WBC 07/16/2022 5.6    RBC 07/16/2022 4.49    Hemoglobin 07/16/2022 13.7    HCT 07/16/2022 40.5    MCV 07/16/2022 90.3    MCHC 07/16/2022 33.8    RDW 07/16/2022 13.2    Platelets 07/16/2022 228.0    Neutrophils Relative % 07/16/2022 58.1    Lymphocytes Relative 07/16/2022 30.4    Monocytes Relative 07/16/2022 8.9    Eosinophils Relative 07/16/2022 1.5    Basophils Relative 07/16/2022 1.1    Neutro Abs 07/16/2022 3.2    Lymphs Abs 07/16/2022 1.7    Monocytes Absolute 07/16/2022 0.5    Eosinophils Absolute 07/16/2022 0.1    Basophils Absolute 07/16/2022 0.1    Sodium 07/16/2022 139    Potassium 07/16/2022 4.4    Chloride 07/16/2022 104    CO2 07/16/2022 27    Glucose, Bld 07/16/2022 96    BUN 07/16/2022 13    Creatinine, Ser 07/16/2022 0.87    Total Bilirubin 07/16/2022 1.0    Alkaline Phosphatase 07/16/2022 46    AST 07/16/2022 13    ALT 07/16/2022 11    Total Protein 07/16/2022 7.2    Albumin 07/16/2022 4.7    GFR 07/16/2022 89.93    Calcium 07/16/2022 9.7    Cholesterol 07/16/2022 167    Triglycerides 07/16/2022 40.0    HDL 07/16/2022 69.50    VLDL 07/16/2022 8.0    LDL Cholesterol 07/16/2022 89    Total CHOL/HDL Ratio 07/16/2022 2    NonHDL 07/16/2022 97.10    VITD 07/16/2022 49.33    No image results found.   No results found.  No results found.       Additional Info: This encounter employed real-time, collaborative documentation. The patient actively reviewed and updated their medical record on a shared screen, ensuring transparency and facilitating joint problem-solving for the problem list, overview, and plan. This approach promotes accurate, informed care. The treatment plan was discussed and reviewed in detail,  including medication safety, potential side effects, and all patient questions. We confirmed understanding and comfort with the plan. Follow-up instructions were established, including contacting the office for any concerns, returning if symptoms worsen, persist, or new symptoms develop, and precautions for potential emergency department visits.

## 2023-10-27 NOTE — Discharge Instructions (Addendum)
You were seen in the ER today for abdominal pain. Your labs and imaging area reassuring, but do reveal that you have colitis. This would explain your pain and diarrhea you have been experiencing. Given this finding, I would recommend starting on azithromycin to attempt to treat this. Follow up with your primary care provider for further evaluation. If your symptoms are worsening, please return to the ER. You may continue to take Zofran and Bentyl for nausea and cramping as needed.

## 2023-10-27 NOTE — Patient Instructions (Signed)
VISIT SUMMARY:  You came in today with severe lower abdominal pain and diarrhea that has been ongoing for four days. You also reported feeling exhausted and dehydrated. Despite trying over-the-counter medications and a prescription from urgent care, your symptoms have not improved. Given the severity of your symptoms, we discussed the need for immediate evaluation to rule out serious conditions like appendicitis or an ovarian cyst rupture.  YOUR PLAN:  -ABDOMINAL PAIN WITH DIARRHEA: Your severe lower abdominal pain and diarrhea could be due to appendicitis, which is an inflammation of the appendix that can lead to serious complications if not treated. Another possibility is an ovarian cyst rupture. We recommend you go to the ER immediately for further evaluation, including lab tests and a CT scan. You should not eat or drink anything except water until you are evaluated. Pain management with morphine and possibly IV antibiotics or surgery may be necessary depending on the findings.  -DEHYDRATION: You are feeling dehydrated due to ongoing diarrhea and not being able to drink enough fluids. IV fluids at the ER will help rehydrate you and improve your symptoms.  INSTRUCTIONS:  Please go to the ER immediately for further evaluation and management of your symptoms. This includes lab tests, imaging, and possibly IV fluids and pain management. Do not eat or drink anything except water until you are evaluated.

## 2023-10-27 NOTE — ED Provider Notes (Signed)
[place] EMERGENCY DEPARTMENT AT [facility] Provider Note   CSN: [id number] Arrival date & time: 10/27/23  1235     History Chief Complaint  Patient presents with   Abdominal Pain    Katherine Wise is a 32 y.o. female.  Patient with past history significant for dysmenorrhea, asthma, generalized anxiety disorder presents the emergency department concerns of abdominal pain.  Reports that she lateralized abdominal pain and diarrhea.  States that the pain became significantly worse yesterday and has been primarily in the lower midline abdomen.  Does also endorse some mild urinary changes with some increased urgency although denies any dysuria, hematuria, or anuria.  Has been focused on hydration at home taking oral hydration.  No recent contact with any individuals with similar symptoms.  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 Patient is sexually active but denies chance of pregnancy.   Abdominal Pain      Home Medications Prior to Admission medications   Medication Sig Start Date End Date Taking? Authorizing Provider  azithromycin (ZITHROMAX) 250 MG tablet Take first 2 tablets together, then 1 every day until finished as directed by physician 10/27/23 11/01/23 Yes Smitty Knudsen, PA-C  albuterol (VENTOLIN HFA) 108 (90 Base) MCG/ACT inhaler Use 2 puffs every four hours as needed for cough or wheeze.  May use 2 puffs 10-20 minutes prior to exercise. 11/09/22   Alfonse Spruce, MD  ALPRAZolam Prudy Feeler) 0.5 MG tablet Take 0.5 mg by mouth as needed. Patient not taking: Reported on 07/26/2023    [provider]  amphetamine-dextroamphetamine (ADDERALL XR) 20 MG 24 hr capsule Take 2 capsules by mouth daily. 04/16/21     amphetamine-dextroamphetamine (ADDERALL XR) 20 MG 24 hr capsule Take 2 capsules by mouth every morning 01/08/22      amphetamine-dextroamphetamine (ADDERALL XR) 20 MG 24 hr capsule Take 2 capsules (40 mg total) by mouth every morning. 02/22/23     amphetamine-dextroamphetamine (ADDERALL XR) 20 MG 24 hr capsule Take 2 capsules (40 mg total) by mouth in the morning. 10/12/23     Ascorbic Acid (VITAMIN C PO) Take by mouth.    [provider]  dicyclomine (BENTYL) 20 MG tablet Take 1 tablet (20 mg total) by mouth 2 (two) times daily. 10/26/23   Garrison, [place] N, FNP  fexofenadine (ALLEGRA) 180 MG tablet Take 1 tablet (180 mg total) by mouth daily. 06/28/18   Alfonse Spruce, MD  fluconazole (DIFLUCAN) 150 MG tablet Take 1 tablet (150 mg total) by mouth now. May repeat after 3 days if needed. 10/28/23   Shelva Majestic, MD  fluticasone (FLONASE) 50 MCG/ACT nasal spray Place 1-2 sprays into both nostrils 2 (two) times a day. During the worst times of the year. 03/29/19   Alfonse Spruce, MD  Fluticasone-Umeclidin-Vilant (TRELEGY ELLIPTA) 100-62.5-25 MCG/ACT AEPB Inhale 1 puff into the lungs daily. 06/07/23   Alfonse Spruce, MD  Probiotic TBEC See admin instructions.    [provider]  traZODone (DESYREL) 50 MG tablet Take 1-3 tablets (50-150 mg total) by mouth at bedtime. 07/07/23     triamcinolone ointment (KENALOG) 0.1 % Apply 1 application topically 2 (two) times daily. 05/12/21   Alfonse Spruce, MD  VITAMIN D PO Take by mouth daily.    [provider]      Allergies    Patient has no known allergies.  Review of Systems   Review of Systems  Gastrointestinal:  Positive for abdominal pain.  All other systems reviewed and are negative.   Physical Exam Updated Vital Signs BP 126/80   Pulse 87   Temp 99.3 F (37.4 C) (Oral)   Resp 18   Ht 5\' 7"  (1.702 m)   Wt 70.4 kg   LMP 10/20/2023 (Exact Date)   SpO2 100%   BMI 24.31 kg/m  Physical Exam Vitals and nursing note reviewed.  Constitutional:      General: She is not in acute distress.    Appearance:  She is well-developed.  HENT:     Head: Normocephalic and atraumatic.  Eyes:     Conjunctiva/sclera: Conjunctivae normal.  Cardiovascular:     Rate and Rhythm: Normal rate and regular rhythm.     Heart sounds: No murmur heard. Pulmonary:     Effort: Pulmonary effort is normal. No respiratory distress.     Breath sounds: Normal breath sounds.  Abdominal:     General: Abdomen is flat. Bowel sounds are increased. There is no distension.     Palpations: Abdomen is soft.     Tenderness: There is abdominal tenderness in the periumbilical area and suprapubic area. There is no right CVA tenderness or left CVA tenderness.  Musculoskeletal:        General: No swelling.     Cervical back: Neck supple.  Skin:    General: Skin is warm and dry.     Capillary Refill: Capillary refill takes less than 2 seconds.  Neurological:     Mental Status: She is alert.  Psychiatric:        Mood and Affect: Mood normal.     ED Results / Procedures / Treatments   Labs (all labs ordered are listed, but only abnormal results are displayed) Labs Reviewed  GASTROINTESTINAL PANEL BY PCR, STOOL (REPLACES STOOL CULTURE) - Abnormal; Notable for the following components:      Result Value   Salmonella species DETECTED (*)    All other components within normal limits  COMPREHENSIVE METABOLIC PANEL - Abnormal; Notable for the following components:   Potassium 3.3 (*)    Glucose, Bld 112 (*)    Calcium 8.8 (*)    All other components within normal limits  URINALYSIS, ROUTINE W REFLEX MICROSCOPIC - Abnormal; Notable for the following components:   Specific Gravity, Urine 1.004 (*)    All other components within normal limits  C DIFFICILE QUICK SCREEN W PCR REFLEX    LIPASE, BLOOD  CBC  HCG, SERUM, QUALITATIVE    EKG None  Radiology No results found.  Procedures Procedures   Medications Ordered in ED Medications  ondansetron (ZOFRAN) injection 4 mg (4 mg Intravenous Given 10/27/23 1303)  lactated  ringers bolus 1,000 mL ( Intravenous Stopped 10/27/23 1406)  fentaNYL (SUBLIMAZE) injection 50 mcg (50 mcg Intravenous Given 10/27/23 1308)  morphine (PF) 4 MG/ML injection 4 mg (4 mg Intravenous Given 10/27/23 1354)  iohexol (OMNIPAQUE) 300 MG/ML solution 100 mL (100 mLs Intravenous Contrast Given 10/27/23 1446)  morphine (PF) 4 MG/ML injection 4 mg (4 mg Intravenous Given 10/27/23 1612)    ED Course/ Medical Decision Making/ A&P                               Medical Decision Making Amount and/or Complexity of Data Reviewed Labs: ordered. Radiology: ordered.  Risk Prescription drug management.   This patient  presents to the ED for concern of abdominal pain.  Differential diagnosis includes appendicitis, UTI, urolithiasis, bowel obstruction, cystitis   Lab Tests:  I Ordered, and personally interpreted labs.  The pertinent results include:  CBC is unremarkable with no signs of leukocytosis, CMP with mild hypokalemia at 3.3 likely from GI loss, no evidence of AKI or acute dehydration, lipase is unremarkable, hcg is negative, UA is negative, C diff and GI panel collected and pending.   Imaging Studies ordered:  I ordered imaging studies including CT abdomen pelvis I independently visualized and interpreted imaging which showed colitis, non-obstructing left kidney stone I agree with the radiologist interpretation   Medicines ordered and prescription drug management:  I ordered medication including fluids, fentanyl, morphine, zofran for volume loss, pain, and nausea  Reevaluation of the patient after these medicines showed that the patient improved I have reviewed the patients home medicines and have made adjustments as needed   Problem List / ED Course:  Patient with past history significant for dysmenorrhea, asthma, generalized anxiety disorder presents to the emergency department concerns of abdominal pain.  She reports the pain is primarily focal in the suprapubic and right lower  quadrants.  Has had multiple episodes of diarrhea over the last several days and some nausea but denies any vomiting.  No fever, chills or bodyaches.  No prior history of appendicitis as well as appendix in place.  Denies any acute worsening symptoms with oral intake.  Has been try to focus on oral hydration.  Does report that she had 1 episode of slightly pink-tinged diarrhea although does state that she has been eating very little and staying hydrated primarily with red-colored hydration solutions.  Not currently on blood thinners. On exam, pain is focal to the right lower quadrant.  No significant CVA tenderness.  Bowel sounds are slightly increased.  Given area of pain, labs and imaging are ordered for assessment of symptoms.  Fluids, fentanyl, and Zofran given for symptom management.  Will reassess patient shortly. Labs largely unremarkable. Mild hypokalemia likely from volume loss, but not acutely dehydrated likely due to patient pushing aggressive hydration at home. CT imaging pending. Some symptomatic improvement after medications. Will add on morphine as she states pain control from fentanyl was brief. CT imaging with findings of colitis. Incidental findings of nonobstructive left kidney stone. Reassessed patient who had better pain control after switching to morphine. Stool sample collected and will be sent off for testing. Doubt c.diff given lack or recent antibiotic use or recent contact with individuals with known c. Diff. Advised patient that GI panel would likely not result until 2 days or so from collection but decision made to initiate antibiotics given severity of symptoms and patient reporting some slight pink tinge to stool concerning for bloody stool. In setting of likely acute gastroenteritis, doubt GI bleed. Will treat empirically with azithromycin but advised this may be revised if patient's stool testing is concerning for other pathogen. Discussed dietary management of symptoms at home  primarily consisting of aggressive hydration and then slowly switching to bland solids before returning to baseline diet. No other acute concerns at this time. Patient discharged home in stable condition.   Final Clinical Impression(s) / ED Diagnoses Final diagnoses:  Colitis  Hypokalemia    Rx / DC Orders ED Discharge Orders          Ordered    azithromycin (ZITHROMAX) 250 MG tablet  Daily        10/27/23 1713  Smitty Knudsen, PA-C 10/31/23 2205    Glendora Score, MD 11/01/23 1240

## 2023-10-27 NOTE — ED Triage Notes (Signed)
Patient is here for evaluation of abdominal pain with diarrhea since Monday. Reports abdominal pain became very severe yesterday. Reports pain is mostly in the lower middle abdomen area. States her PCP sent her here to rule out appendicitis.

## 2023-10-27 NOTE — ED Notes (Signed)
Pt teaching provided on medications that may cause drowsiness. Pt instructed not to drive or operate heavy machinery while taking the prescribed medication. Pt verbalized understanding.   Pt provided discharge instructions and prescription information. Pt was given the opportunity to ask questions and questions were answered.   

## 2023-10-28 ENCOUNTER — Telehealth: Payer: 59 | Admitting: Nurse Practitioner

## 2023-10-28 ENCOUNTER — Other Ambulatory Visit (HOSPITAL_COMMUNITY): Payer: Self-pay

## 2023-10-28 DIAGNOSIS — A09 Infectious gastroenteritis and colitis, unspecified: Secondary | ICD-10-CM

## 2023-10-28 LAB — GASTROINTESTINAL PANEL BY PCR, STOOL (REPLACES STOOL CULTURE)

## 2023-10-28 MED ORDER — FLUCONAZOLE 150 MG PO TABS
150.0000 mg | ORAL_TABLET | ORAL | 0 refills | Status: DC
  Filled 2023-10-28: qty 2, 3d supply, fill #0
  Filled 2023-10-29: qty 2, 6d supply, fill #0

## 2023-10-28 NOTE — Progress Notes (Signed)
Virtual Visit Consent   Katherine Wise, you are scheduled for a virtual visit with a [facility] provider today. Just as with appointments in the office, your consent must be obtained to participate. Your consent will be active for this visit and any virtual visit you may have with one of our providers in the next 365 days. If you have a MyChart account, a copy of this consent can be sent to you electronically.  As this is a virtual visit, video technology does not allow for your provider to perform a traditional examination. This may limit your provider's ability to fully assess your condition. If your provider identifies any concerns that need to be evaluated in person or the need to arrange testing (such as labs, EKG, etc.), we will make arrangements to do so. Although advances in technology are sophisticated, we cannot ensure that it will always work on either your end or our end. If the connection with a video visit is poor, the visit may have to be switched to a telephone visit. With either a video or telephone visit, we are not always able to ensure that we have a secure connection.  By engaging in this virtual visit, you consent to the provision of healthcare and authorize for your insurance to be billed (if applicable) for the services provided during this visit. Depending on your insurance coverage, you may receive a charge related to this service.  I need to obtain your verbal consent now. Are you willing to proceed with your visit today? Katherine Wise has provided verbal consent on 10/28/2023 for a virtual visit (video or telephone). Viviano Simas, FNP  Date: 10/28/2023 7:15 PM  Virtual Visit via Video Note   I, Viviano Simas, connected with  Katherine Wise  ([id number], September 19, 1991) on 10/28/23 at  7:15 PM EST by a video-enabled telemedicine application and verified that I am speaking with the correct person using two identifiers.  Location: Patient: Virtual Visit Location Patient:  Home Provider: Virtual Visit Location Provider: Home Office   I discussed the limitations of evaluation and management by telemedicine and the availability of in person appointments. The patient expressed understanding and agreed to proceed.    History of Present Illness: Katherine Wise is a 32 y.o. who identifies as a female who was assigned female at birth, and is being seen today for "Diarrhea and severe abd pain x 4 days Er visit 12/5 abd CT positive for colitis. Was prescribed 6 tab azithromycin 250 mg. 2 tab taken yesterday and 1tab today. GI panel results showing positive for salmonella. Unsure if current abx course is sufficient. Severe abd pain continues"  Started to have diarrhea and stomach upset 10/24/23 Cramping started 10/26/23 Ended up going to ED yesterday  Low grade fevers up to 100 throughout the week  She has had nausea has not vomited (using Zofran as needed)  She did wake up in the night overnight with urge to have BM   She has been using imodium as well   She was treated with azithromycin started 500mg  yesterday and has had 250mg  today without any improvement in symptoms  Also using Bentyl as needed  She is able to eat and drink  Her urine is clear  She was given IV fluids in the ER     Recent Results (from the past 2160 hour(s))  Lipase, blood     Status: None   Collection Time: 10/27/23 12:53 PM  Result Value Ref Range   Lipase  24 11 - 51 U/L    Comment: Performed at [facility], [address], [place], [place] [address]  Comprehensive metabolic panel     Status: Abnormal   Collection Time: 10/27/23 12:53 PM  Result Value Ref Range   Sodium 135 135 - 145 mmol/L   Potassium 3.3 (L) 3.5 - 5.1 mmol/L   Chloride 101 98 - 111 mmol/L   CO2 24 22 - 32 mmol/L   Glucose, Bld 112 (H) 70 - 99 mg/dL    Comment: Glucose reference range applies only to samples taken after fasting for at least 8 hours.   BUN 6 6 - 20 mg/dL   Creatinine, Ser 5.18  0.44 - 1.00 mg/dL   Calcium 8.8 (L) 8.9 - 10.3 mg/dL   Total Protein 6.7 6.5 - 8.1 g/dL   Albumin 3.8 3.5 - 5.0 g/dL   AST 18 15 - 41 U/L   ALT 20 0 - 44 U/L   Alkaline Phosphatase 49 38 - 126 U/L   Total Bilirubin 0.6 <1.2 mg/dL   GFR, Estimated >84 >16 mL/min    Comment: (NOTE) Calculated using the CKD-EPI Creatinine Equation (2021)    Anion gap 10 5 - 15    Comment: Performed at [facility], [address], [place], [place] [address]  CBC     Status: None   Collection Time: 10/27/23 12:53 PM  Result Value Ref Range   WBC 4.2 4.0 - 10.5 K/uL   RBC 4.77 3.87 - 5.11 MIL/uL   Hemoglobin 14.3 12.0 - 15.0 g/dL   HCT 16.0 10.9 - 32.3 %   MCV 87.6 80.0 - 100.0 fL   MCH 30.0 26.0 - 34.0 pg   MCHC 34.2 30.0 - 36.0 g/dL   RDW 55.7 32.2 - 02.5 %   Platelets 244 150 - 400 K/uL   nRBC 0.0 0.0 - 0.2 %    Comment: Performed at [facility], [address], [place], [place] [address]  hCG, serum, qualitative     Status: None   Collection Time: 10/27/23 12:53 PM  Result Value Ref Range   Preg, Serum NEGATIVE NEGATIVE    Comment:        THE SENSITIVITY OF THIS METHODOLOGY IS >10 mIU/mL. Performed at [facility], [address] [place], [place] [address]   Urinalysis, Routine w reflex microscopic -Urine, Clean Catch     Status: Abnormal   Collection Time: 10/27/23  2:32 PM  Result Value Ref Range   Color, Urine YELLOW YELLOW   APPearance CLEAR CLEAR   Specific Gravity, Urine 1.004 (L) 1.005 - 1.030   pH 7.0 5.0 - 8.0   Glucose, UA NEGATIVE NEGATIVE mg/dL   Hgb urine dipstick NEGATIVE NEGATIVE   Bilirubin Urine NEGATIVE NEGATIVE   Ketones, ur NEGATIVE NEGATIVE mg/dL   Protein, ur NEGATIVE NEGATIVE mg/dL   Nitrite NEGATIVE NEGATIVE   Leukocytes,Ua NEGATIVE NEGATIVE    Comment: Performed at [facility], [address] [place], [place] [address]  Gastrointestinal Panel by PCR , Stool     Status: Abnormal    Collection Time: 10/27/23  4:38 PM   Specimen: Stool  Result Value Ref Range   Campylobacter species NOT DETECTED NOT DETECTED   Plesimonas shigelloides NOT DETECTED NOT DETECTED   Salmonella species DETECTED (A) NOT DETECTED    Comment: RESULT CALLED TO, READ BACK BY AND VERIFIED WITH: LORI BERDIK AT 1158 10/28/23.PMF    Yersinia enterocolitica  NOT DETECTED NOT DETECTED   Vibrio species NOT DETECTED NOT DETECTED   Vibrio cholerae NOT DETECTED NOT DETECTED   Enteroaggregative E coli (EAEC) NOT DETECTED NOT DETECTED   Enteropathogenic E coli (EPEC) NOT DETECTED NOT DETECTED   Enterotoxigenic E coli (ETEC) NOT DETECTED NOT DETECTED   Shiga like toxin producing E coli (STEC) NOT DETECTED NOT DETECTED   Shigella/Enteroinvasive E coli (EIEC) NOT DETECTED NOT DETECTED   Cryptosporidium NOT DETECTED NOT DETECTED   Cyclospora cayetanensis NOT DETECTED NOT DETECTED   Entamoeba histolytica NOT DETECTED NOT DETECTED   Giardia lamblia NOT DETECTED NOT DETECTED   Adenovirus F40/41 NOT DETECTED NOT DETECTED   Astrovirus NOT DETECTED NOT DETECTED   Norovirus GI/GII NOT DETECTED NOT DETECTED   Rotavirus A NOT DETECTED NOT DETECTED   Sapovirus (I, II, IV, and V) NOT DETECTED NOT DETECTED    Comment: Performed at [facility], [address] [place], [place] [address]  C Difficile Quick Screen w PCR reflex     Status: None   Collection Time: 10/27/23  4:38 PM   Specimen: Stool  Result Value Ref Range   C Diff antigen NEGATIVE NEGATIVE   C Diff toxin NEGATIVE NEGATIVE   C Diff interpretation No C. difficile detected.     Comment: Performed at [facility], [address] [place], [place] [address]    Problems:  Patient Active Problem List   Diagnosis Date Noted   Moderate persistent asthma, uncomplicated 11/16/2020   Seasonal and perennial allergic rhinitis 11/16/2020   Asthma 01/16/2016   Allergic rhinitis 01/16/2016   Attention deficit disorder (ADD) in  adult 03/18/2015   Dysmenorrhea 03/18/2015   Generalized anxiety disorder 01/18/2014    Allergies: No Known Allergies Medications:  Current Outpatient Medications:    albuterol (VENTOLIN HFA) 108 (90 Base) MCG/ACT inhaler, Use 2 puffs every four hours as needed for cough or wheeze.  May use 2 puffs 10-20 minutes prior to exercise., Disp: 6.7 g, Rfl: 1   ALPRAZolam (XANAX) 0.5 MG tablet, Take 0.5 mg by mouth as needed. (Patient not taking: Reported on 07/26/2023), Disp: , Rfl:    amphetamine-dextroamphetamine (ADDERALL XR) 20 MG 24 hr capsule, Take 2 capsules by mouth daily., Disp: 180 capsule, Rfl: 0   amphetamine-dextroamphetamine (ADDERALL XR) 20 MG 24 hr capsule, Take 2 capsules by mouth every morning, Disp: 180 capsule, Rfl: 0   amphetamine-dextroamphetamine (ADDERALL XR) 20 MG 24 hr capsule, Take 2 capsules (40 mg total) by mouth every morning., Disp: 180 capsule, Rfl: 0   amphetamine-dextroamphetamine (ADDERALL XR) 20 MG 24 hr capsule, Take 2 capsules (40 mg total) by mouth in the morning., Disp: 60 capsule, Rfl: 0   Ascorbic Acid (VITAMIN C PO), Take by mouth., Disp: , Rfl:    azithromycin (ZITHROMAX) 250 MG tablet, Take first 2 tablets together, then 1 every day until finished as directed by physician, Disp: 6 tablet, Rfl: 0   dicyclomine (BENTYL) 20 MG tablet, Take 1 tablet (20 mg total) by mouth 2 (two) times daily., Disp: 20 tablet, Rfl: 0   fexofenadine (ALLEGRA) 180 MG tablet, Take 1 tablet (180 mg total) by mouth daily., Disp: 30 tablet, Rfl: 5   fluconazole (DIFLUCAN) 150 MG tablet, Take 1 tablet (150 mg total) by mouth once for 1 dose. May repeat after 3 days if needed., Disp: 2 tablet, Rfl: 0   fluticasone (FLONASE) 50 MCG/ACT nasal spray, Place 1-2 sprays into both nostrils 2 (two) times a day. During the worst  times of the year., Disp: 16 g, Rfl: 5   Fluticasone-Umeclidin-Vilant (TRELEGY ELLIPTA) 100-62.5-25 MCG/ACT AEPB, Inhale 1 puff into the lungs daily., Disp: 60 each, Rfl:  5   Probiotic TBEC, See admin instructions., Disp: , Rfl:    traZODone (DESYREL) 50 MG tablet, Take 1-3 tablets (50-150 mg total) by mouth at bedtime., Disp: 270 tablet, Rfl: 2   triamcinolone ointment (KENALOG) 0.1 %, Apply 1 application topically 2 (two) times daily., Disp: 30 g, Rfl: 5   VITAMIN D PO, Take by mouth daily., Disp: , Rfl:   Observations/Objective: Patient is well-developed, well-nourished in no acute distress.  Resting comfortably  at home.  Head is normocephalic, atraumatic.  No labored breathing.  Speech is clear and coherent with logical content.  Patient is alert and oriented at baseline.    Assessment and Plan:  1. Diarrhea of infectious origin  Discussed with patient that expected improvement would be after 48 hours on antibiotics. Encouraged continuing plan as is  Pushing fluids and BRAT/bland diet  May continue to use Zofran and Bentyl as needed  Discouraged imodium   If no improvement by the end of antibiotic course or with new/worsening symptoms follow up as discussed  Also encouraged probiotic use       Follow Up Instructions: I discussed the assessment and treatment plan with the patient. The patient was provided an opportunity to ask questions and all were answered. The patient agreed with the plan and demonstrated an understanding of the instructions.  A copy of instructions were sent to the patient via MyChart unless otherwise noted below.    The patient was advised to call back or seek an in-person evaluation if the symptoms worsen or if the condition fails to improve as anticipated.    Viviano Simas, FNP

## 2023-10-29 ENCOUNTER — Other Ambulatory Visit (HOSPITAL_BASED_OUTPATIENT_CLINIC_OR_DEPARTMENT_OTHER): Payer: Self-pay

## 2023-10-29 ENCOUNTER — Other Ambulatory Visit (HOSPITAL_COMMUNITY): Payer: Self-pay

## 2023-10-30 ENCOUNTER — Encounter: Payer: Self-pay | Admitting: Family Medicine

## 2023-11-01 ENCOUNTER — Encounter: Payer: Self-pay | Admitting: Allergy & Immunology

## 2023-11-01 ENCOUNTER — Other Ambulatory Visit (HOSPITAL_BASED_OUTPATIENT_CLINIC_OR_DEPARTMENT_OTHER): Payer: Self-pay

## 2023-11-01 ENCOUNTER — Other Ambulatory Visit (HOSPITAL_COMMUNITY): Payer: Self-pay

## 2023-11-01 ENCOUNTER — Other Ambulatory Visit: Payer: Self-pay

## 2023-11-01 ENCOUNTER — Ambulatory Visit (INDEPENDENT_AMBULATORY_CARE_PROVIDER_SITE_OTHER): Payer: 59 | Admitting: Allergy & Immunology

## 2023-11-01 VITALS — BP 116/80 | HR 92 | Temp 98.5°F | Resp 16 | Ht 67.13 in | Wt 153.2 lb

## 2023-11-01 DIAGNOSIS — J302 Other seasonal allergic rhinitis: Secondary | ICD-10-CM

## 2023-11-01 DIAGNOSIS — J3089 Other allergic rhinitis: Secondary | ICD-10-CM

## 2023-11-01 DIAGNOSIS — R21 Rash and other nonspecific skin eruption: Secondary | ICD-10-CM | POA: Diagnosis not present

## 2023-11-01 DIAGNOSIS — J4540 Moderate persistent asthma, uncomplicated: Secondary | ICD-10-CM | POA: Diagnosis not present

## 2023-11-01 MED ORDER — FLUTICASONE FUROATE-VILANTEROL 100-25 MCG/ACT IN AEPB
1.0000 | INHALATION_SPRAY | Freq: Every day | RESPIRATORY_TRACT | 0 refills | Status: DC
  Filled 2023-11-01: qty 60, 30d supply, fill #0

## 2023-11-01 MED ORDER — CIPROFLOXACIN HCL 500 MG PO TABS
500.0000 mg | ORAL_TABLET | Freq: Two times a day (BID) | ORAL | 0 refills | Status: DC
  Filled 2023-11-01: qty 14, 7d supply, fill #0

## 2023-11-01 NOTE — Patient Instructions (Addendum)
1. Moderate persistent asthma, uncomplicated - Lung testing looks great today.  - I think you are doing great with the current regimen.  - Let's step down therapy to Breo instead of Trelegy (Breo contains a steroid and a long acting albuterol without the third medication to relax your smooth muscle). - Daily controller medication(s): Breo 100/67mcg one puff once daily  - Prior to physical activity: albuterol 2 puffs 10-15 minutes before physical activity. - Rescue medications: albuterol 4 puffs every 4-6 hours as needed - Asthma control goals:  * Full participation in all desired activities (may need albuterol before activity) * Albuterol use two time or less a week on average (not counting use with activity) * Cough interfering with sleep two time or less a month * Oral steroids no more than once a year * No hospitalizations  2. Perennial allergic rhinitis - Latest testing showed: indoor molds, outdoor molds, dust mites, cat, and cockroach - Continue with Claritin 10mg  daily as you are doing. - You can alternate antihistamines if you feel that one is not working as effectively.  3. Return in about 1 year (around 10/31/2024). You can have the follow up appointment with Dr. Dellis Anes or a Nurse Practicioner (our Nurse Practitioners are excellent and always have Physician oversight!).    Please inform us of any Emergency Department visits, hospitalizations, or changes in symptoms. Call us before going to the ED for breathing or allergy symptoms since we might be able to fit you in for a sick visit. Feel free to contact us anytime with any questions, problems, or concerns.  It was a pleasure to see you again today!  Websites that have reliable patient information: 1. American Academy of Asthma, Allergy, and Immunology: www.aaaai.org 2. Food Allergy Research and Education (FARE): foodallergy.org 3. Mothers of Asthmatics: http://www.asthmacommunitynetwork.org 4. American College of Allergy,  Asthma, and Immunology: www.acaai.org      "Like" [place] on Facebook and Instagram for our latest updates!      A healthy democracy works best when [employer] participate! Make sure you are registered to vote! If you have moved or changed any of your contact information, you will need to get this updated before voting! Scan the QR codes below to learn more!

## 2023-11-01 NOTE — Progress Notes (Unsigned)
FOLLOW UP  Date of Service/Encounter:  11/02/23   Assessment:   Moderate persistent asthma without complication - now doing well on Trelegy (and more compliant since it is more affordable)   Seasonal and perennial allergic rhinitis (indoor molds, outdoor molds, dust mites, cat, and cockroach)    Fully vaccinated and boosted to [employer] worker   Rash of hands - on extensor surface, but lasts only 1-3 hours (occurs 1-2 times per week)  Plan/Recommendations:   1. Moderate persistent asthma, uncomplicated - Lung testing looks great today.  - I think you are doing great with the current regimen.  - Let's step down therapy to Breo instead of Trelegy (Breo contains a steroid and a long acting albuterol without the third medication to relax your smooth muscle). - Daily controller medication(s): Breo 100/91mcg one puff once daily  - Prior to physical activity: albuterol 2 puffs 10-15 minutes before physical activity. - Rescue medications: albuterol 4 puffs every 4-6 hours as needed - Asthma control goals:  * Full participation in all desired activities (may need albuterol before activity) * Albuterol use two time or less a week on average (not counting use with activity) * Cough interfering with sleep two time or less a month * Oral steroids no more than once a year * No hospitalizations  2. Perennial allergic rhinitis - Latest testing showed: indoor molds, outdoor molds, dust mites, cat, and cockroach - Continue with Claritin 10mg  daily as you are doing. - You can alternate antihistamines if you feel that one is not working as effectively.  3. Return in about 1 year (around 10/31/2024). You can have the follow up appointment with Dr. Dellis Anes or a Nurse Practicioner (our Nurse Practitioners are excellent and always have Physician oversight!).    Subjective:   Katherine Wise is a 32 y.o. female presenting today for follow up of  Chief Complaint  Patient presents  with   Asthma    States that her asthma is great.    Katherine Wise has a history of the following: Patient Active Problem List   Diagnosis Date Noted   Moderate persistent asthma, uncomplicated 11/16/2020   Seasonal and perennial allergic rhinitis 11/16/2020   Asthma 01/16/2016   Allergic rhinitis 01/16/2016   Attention deficit disorder (ADD) in adult 03/18/2015   Dysmenorrhea 03/18/2015   Generalized anxiety disorder 01/18/2014    History obtained from: chart review and patient.  Discussed the use of AI scribe software for clinical note transcription with the patient and/or guardian, who gave verbal consent to proceed.  Katherine Wise is a 32 y.o. female presenting for a follow up visit.  She was last seen in December 2023.  At that time, like testing was not done.  We stopped the montelukast and she never noticed a difference.  We continue with the Trelegy 100 mcg 1 puff once daily and albuterol as needed.  She underwent testing at the last visit that showed multiple indoor allergens as well as some outdoor molds.  She was doing very well without medications and decided just to continue with Claritin as needed.  We did discuss allergy shots for long-term control, but she felt that she did not need them at this time.  Since last visit, she has done well.  Asthma/Respiratory Symptom History: She reports a significant improvement in her breathing. She is currently on Trelegy, which she uses daily, and has stopped taking Singulair. She notes that she has not been exercising as much recently,  but when she does experience issues, she uses her albuterol inhaler. She denies any recent use of prednisone.  The patient was previously on Breo, but reports that Trelegy has been more effective in managing her symptoms. She expresses willingness to step down to [facility] again to see if it is sufficient for her current condition. She also mentions that she has to use a coupon every year for the Trelegy due to its  cost.  The patient's sister, who also has respiratory issues, was recently hospitalized. The patient expresses concern for her sister and mentions that she also needs to seek medical attention.   Allergic Rhinitis Symptom History: In terms of allergies, the patient is currently only taking Claritin. She also mentions a history of social smoking in her early twenties, but it was not a regular habit and she does not believe it has significantly impacted her respiratory health.  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  She seems to enjoy this new role as it has more regular hours.  Otherwise, there have been no changes to her past medical history, surgical history, family history, or social history.    Review of systems otherwise negative other than that mentioned in the HPI.    Objective:   Blood pressure 116/80, pulse 92, temperature 98.5 F (36.9 C), temperature source Temporal, resp. rate 16, height 5' 7.13" (1.705 m), weight 153 lb 3.2 oz (69.5 kg), last menstrual period 10/20/2023, SpO2 97%. Body mass index is 23.9 kg/m.    Physical Exam Vitals reviewed.  Constitutional:      Appearance: She is well-developed.     Comments: Very pleasant.  Cooperative.  HENT:     Head: Normocephalic and atraumatic.     Right Ear: Tympanic membrane, ear canal and external ear normal. No drainage, swelling or tenderness. Tympanic membrane is not injected, scarred, erythematous, retracted or bulging.     Left Ear: Tympanic membrane, ear canal and external ear normal. No drainage, swelling or tenderness. Tympanic membrane is not injected, scarred, erythematous, retracted or bulging.     Nose: No nasal deformity, septal deviation, mucosal edema or rhinorrhea.     Right Turbinates: Enlarged, swollen and pale.     Left Turbinates: Enlarged, swollen and pale.     Right Sinus: No maxillary sinus tenderness or frontal sinus tenderness.     Left Sinus: No  maxillary sinus tenderness or frontal sinus tenderness.     Mouth/Throat:     Mouth: Mucous membranes are not pale and not dry.     Pharynx: Uvula midline.  Eyes:     General:        Right eye: No discharge.        Left eye: No discharge.     Conjunctiva/sclera: Conjunctivae normal.     Right eye: Right conjunctiva is not injected. No chemosis.    Left eye: Left conjunctiva is not injected. No chemosis.    Pupils: Pupils are equal, round, and reactive to light.  Cardiovascular:     Rate and Rhythm: Normal rate and regular rhythm.     Heart sounds: Normal heart sounds.  Pulmonary:     Effort: Pulmonary effort is normal. No tachypnea, accessory muscle usage or respiratory distress.     Breath sounds: Normal breath sounds. No wheezing, rhonchi or rales.  Chest:     Chest wall: No tenderness.  Abdominal:     Tenderness: There is no abdominal tenderness. There is no  guarding or rebound.  Lymphadenopathy:     Head:     Right side of head: No submandibular, tonsillar or occipital adenopathy.     Left side of head: No submandibular, tonsillar or occipital adenopathy.     Cervical: No cervical adenopathy.  Skin:    Coloration: Skin is not pale.     Findings: No abrasion, erythema, petechiae or rash. Rash is not papular, urticarial or vesicular.  Neurological:     Mental Status: She is alert.  Psychiatric:        Behavior: Behavior is cooperative.      Diagnostic studies:    Spirometry: results normal (FEV1: 4.18/135%, FVC: 4.66/127%, FEV1/FVC: 90%).    Spirometry consistent with normal pattern.   Allergy Studies: none        Malachi Bonds, MD  [facility]

## 2023-11-02 ENCOUNTER — Encounter: Payer: Self-pay | Admitting: Allergy & Immunology

## 2023-11-11 ENCOUNTER — Other Ambulatory Visit (HOSPITAL_BASED_OUTPATIENT_CLINIC_OR_DEPARTMENT_OTHER): Payer: Self-pay

## 2023-11-15 ENCOUNTER — Other Ambulatory Visit (HOSPITAL_COMMUNITY): Payer: Self-pay

## 2023-11-22 LAB — MISCELLANEOUS TEST

## 2023-12-05 MED ORDER — FLUTICASONE FUROATE-VILANTEROL 100-25 MCG/ACT IN AEPB
1.0000 | INHALATION_SPRAY | Freq: Every day | RESPIRATORY_TRACT | 5 refills | Status: DC
  Filled 2023-12-05: qty 60, 30d supply, fill #0
  Filled 2024-01-09: qty 60, 30d supply, fill #1
  Filled 2024-02-23: qty 60, 30d supply, fill #2
  Filled 2024-03-31: qty 60, 30d supply, fill #3
  Filled 2024-05-24: qty 60, 30d supply, fill #4
  Filled 2024-07-09: qty 60, 30d supply, fill #5

## 2023-12-06 ENCOUNTER — Other Ambulatory Visit: Payer: Self-pay

## 2023-12-10 ENCOUNTER — Other Ambulatory Visit (HOSPITAL_BASED_OUTPATIENT_CLINIC_OR_DEPARTMENT_OTHER): Payer: Self-pay

## 2024-01-16 ENCOUNTER — Other Ambulatory Visit (HOSPITAL_BASED_OUTPATIENT_CLINIC_OR_DEPARTMENT_OTHER): Payer: Self-pay

## 2024-01-27 ENCOUNTER — Other Ambulatory Visit (HOSPITAL_BASED_OUTPATIENT_CLINIC_OR_DEPARTMENT_OTHER): Payer: Self-pay

## 2024-01-27 MED ORDER — AMPHETAMINE-DEXTROAMPHET ER 20 MG PO CP24
40.0000 mg | ORAL_CAPSULE | Freq: Every morning | ORAL | 0 refills | Status: DC
  Filled 2024-01-27: qty 60, 30d supply, fill #0

## 2024-02-23 ENCOUNTER — Other Ambulatory Visit (HOSPITAL_BASED_OUTPATIENT_CLINIC_OR_DEPARTMENT_OTHER): Payer: Self-pay

## 2024-02-24 ENCOUNTER — Other Ambulatory Visit (HOSPITAL_BASED_OUTPATIENT_CLINIC_OR_DEPARTMENT_OTHER): Payer: Self-pay

## 2024-02-24 MED ORDER — AMPHETAMINE-DEXTROAMPHETAMINE 20 MG PO TABS
20.0000 mg | ORAL_TABLET | Freq: Every day | ORAL | 0 refills | Status: DC
  Filled 2024-02-24: qty 90, 90d supply, fill #0

## 2024-02-25 ENCOUNTER — Other Ambulatory Visit (HOSPITAL_BASED_OUTPATIENT_CLINIC_OR_DEPARTMENT_OTHER): Payer: Self-pay

## 2024-03-23 ENCOUNTER — Other Ambulatory Visit (HOSPITAL_BASED_OUTPATIENT_CLINIC_OR_DEPARTMENT_OTHER): Payer: Self-pay

## 2024-03-23 DIAGNOSIS — F5101 Primary insomnia: Secondary | ICD-10-CM | POA: Diagnosis not present

## 2024-03-23 DIAGNOSIS — F900 Attention-deficit hyperactivity disorder, predominantly inattentive type: Secondary | ICD-10-CM | POA: Diagnosis not present

## 2024-03-23 DIAGNOSIS — N943 Premenstrual tension syndrome: Secondary | ICD-10-CM | POA: Diagnosis not present

## 2024-03-23 DIAGNOSIS — F4321 Adjustment disorder with depressed mood: Secondary | ICD-10-CM | POA: Diagnosis not present

## 2024-03-23 MED ORDER — VILAZODONE HCL 10 MG PO TABS
10.0000 mg | ORAL_TABLET | Freq: Every day | ORAL | 1 refills | Status: AC
  Filled 2024-03-23 – 2024-05-09 (×2): qty 90, 90d supply, fill #0
  Filled 2024-09-24: qty 90, 90d supply, fill #1

## 2024-03-31 ENCOUNTER — Other Ambulatory Visit (HOSPITAL_BASED_OUTPATIENT_CLINIC_OR_DEPARTMENT_OTHER): Payer: Self-pay

## 2024-04-02 ENCOUNTER — Other Ambulatory Visit: Payer: Self-pay

## 2024-04-02 ENCOUNTER — Other Ambulatory Visit (HOSPITAL_BASED_OUTPATIENT_CLINIC_OR_DEPARTMENT_OTHER): Payer: Self-pay

## 2024-04-02 MED ORDER — TRAZODONE HCL 50 MG PO TABS
50.0000 mg | ORAL_TABLET | Freq: Every day | ORAL | 3 refills | Status: AC
  Filled 2024-04-02: qty 270, 90d supply, fill #0
  Filled 2024-07-09: qty 270, 90d supply, fill #1
  Filled 2024-09-20: qty 270, 90d supply, fill #2

## 2024-05-08 ENCOUNTER — Other Ambulatory Visit (HOSPITAL_BASED_OUTPATIENT_CLINIC_OR_DEPARTMENT_OTHER): Payer: Self-pay

## 2024-05-08 MED ORDER — AMPHETAMINE-DEXTROAMPHET ER 20 MG PO CP24
40.0000 mg | ORAL_CAPSULE | Freq: Every morning | ORAL | 0 refills | Status: DC
  Filled 2024-05-08: qty 180, 90d supply, fill #0

## 2024-05-09 ENCOUNTER — Other Ambulatory Visit (HOSPITAL_BASED_OUTPATIENT_CLINIC_OR_DEPARTMENT_OTHER): Payer: Self-pay

## 2024-07-26 ENCOUNTER — Ambulatory Visit: Payer: Self-pay | Admitting: Family Medicine

## 2024-07-26 ENCOUNTER — Encounter: Payer: Self-pay | Admitting: Family Medicine

## 2024-07-26 ENCOUNTER — Ambulatory Visit (INDEPENDENT_AMBULATORY_CARE_PROVIDER_SITE_OTHER): Payer: 59 | Admitting: Family Medicine

## 2024-07-26 VITALS — BP 122/88 | HR 99 | Temp 98.8°F | Ht 67.13 in | Wt 159.2 lb

## 2024-07-26 DIAGNOSIS — Z13220 Encounter for screening for lipoid disorders: Secondary | ICD-10-CM

## 2024-07-26 DIAGNOSIS — Z23 Encounter for immunization: Secondary | ICD-10-CM

## 2024-07-26 DIAGNOSIS — Z1283 Encounter for screening for malignant neoplasm of skin: Secondary | ICD-10-CM | POA: Diagnosis not present

## 2024-07-26 DIAGNOSIS — Z0000 Encounter for general adult medical examination without abnormal findings: Secondary | ICD-10-CM | POA: Diagnosis not present

## 2024-07-26 DIAGNOSIS — Z130 Encounter for screening for diseases of the blood and blood-forming organs and certain disorders involving the immune mechanism: Secondary | ICD-10-CM | POA: Diagnosis not present

## 2024-07-26 LAB — CBC WITH DIFFERENTIAL/PLATELET
Basophils Absolute: 0 K/uL (ref 0.0–0.1)
Basophils Relative: 0.4 % (ref 0.0–3.0)
Eosinophils Absolute: 0 K/uL (ref 0.0–0.7)
Eosinophils Relative: 0.9 % (ref 0.0–5.0)
HCT: 41.7 % (ref 36.0–46.0)
Hemoglobin: 13.6 g/dL (ref 12.0–15.0)
Lymphocytes Relative: 27.1 % (ref 12.0–46.0)
Lymphs Abs: 1.3 K/uL (ref 0.7–4.0)
MCHC: 32.5 g/dL (ref 30.0–36.0)
MCV: 91 fl (ref 78.0–100.0)
Monocytes Absolute: 0.4 K/uL (ref 0.1–1.0)
Monocytes Relative: 9 % (ref 3.0–12.0)
Neutro Abs: 3.1 K/uL (ref 1.4–7.7)
Neutrophils Relative %: 62.6 % (ref 43.0–77.0)
Platelets: 267 K/uL (ref 150.0–400.0)
RBC: 4.59 Mil/uL (ref 3.87–5.11)
RDW: 13.7 % (ref 11.5–15.5)
WBC: 5 K/uL (ref 4.0–10.5)

## 2024-07-26 LAB — COMPREHENSIVE METABOLIC PANEL WITH GFR
ALT: 39 U/L — ABNORMAL HIGH (ref 0–35)
AST: 25 U/L (ref 0–37)
Albumin: 4.9 g/dL (ref 3.5–5.2)
Alkaline Phosphatase: 52 U/L (ref 39–117)
BUN: 11 mg/dL (ref 6–23)
CO2: 28 meq/L (ref 19–32)
Calcium: 9.5 mg/dL (ref 8.4–10.5)
Chloride: 103 meq/L (ref 96–112)
Creatinine, Ser: 0.73 mg/dL (ref 0.40–1.20)
GFR: 109.44 mL/min (ref >60.00)
Glucose, Bld: 94 mg/dL (ref 70–99)
Potassium: 3.7 meq/L (ref 3.5–5.1)
Sodium: 143 meq/L (ref 135–145)
Total Bilirubin: 1 mg/dL (ref 0.2–1.2)
Total Protein: 7.5 g/dL (ref 6.0–8.3)

## 2024-07-26 LAB — IBC + FERRITIN
Ferritin: 30.8 ng/mL (ref 10.0–291.0)
Iron: 146 ug/dL — ABNORMAL HIGH (ref 42–145)
Saturation Ratios: 33.5 % (ref 20.0–50.0)
TIBC: 435.4 ug/dL (ref 250.0–450.0)
Transferrin: 311 mg/dL (ref 212.0–360.0)

## 2024-07-26 LAB — LIPID PANEL
Cholesterol: 197 mg/dL (ref <200)
HDL: 90 mg/dL (ref >=50)
LDL Cholesterol (Calc): 94 mg/dL
Non-HDL Cholesterol (Calc): 107 mg/dL (ref <130)
Total CHOL/HDL Ratio: 2.2 (calc) (ref <5.0)
Triglycerides: 51 mg/dL (ref <150)

## 2024-07-26 NOTE — Patient Instructions (Addendum)
 Prevnar 20 today (pneumonia shot) Flu shot today  When you get new pap smear please have them send us  a copy  We have placed a referral for you today to dermatology - please call their # if you do not hear within a week (may be listed below or you may see mychart message within a few days with #).   Please stop by lab before you go If you have mychart- we will send your results within 3 business days of us  receiving them.  If you do not have mychart- we will call you about results within 5 business days of us  receiving them.  *please also note that you will see labs on mychart as soon as they post. I will later go in and write notes on them- will say notes from Dr. Katrinka   Recommended follow up: Return in about 1 year (around 07/26/2025) for physical or sooner if needed.Schedule b4 you leave.

## 2024-07-26 NOTE — Progress Notes (Signed)
 Phone [phone]   Subjective:  Patient presents today for their annual physical. Chief complaint-noted.   See problem oriented charting- ROS- full  review of systems was completed and negative except for topics noted under acute/chronic concerns   The following were reviewed and entered/updated in epic: Past Medical History:  Diagnosis Date   Abnormal Pap smear of cervix 2020   ADHD (attention deficit hyperactivity disorder) 03/18/2015   Dr. Vincente psychiatry. ? OCD taking Adderall  XR 20mg . May be changing due to anxiety    Dysmenorrhea 03/18/2015   Birth control- Junel improved this.  As of 03/18/15 in homosexual relationship and not active with males-trialing off. Will see if has regular periods. Concerned about libido.     Exercise-induced asthma    Generalized anxiety disorder 01/18/2014   Dr. Vincente psychiatry. Heron Rams for counseling. Xanax 0.5mg  BID prn.  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   Patient Active Problem List   Diagnosis Date Noted   Moderate persistent asthma, uncomplicated 11/16/2020    Priority: Medium    Seasonal and perennial allergic rhinitis 11/16/2020    Priority: Medium    Asthma 01/16/2016    Priority: Medium    Attention deficit disorder (ADD) in adult 03/18/2015    Priority: Medium    Dysmenorrhea 03/18/2015    Priority: Medium    Allergic rhinitis 01/16/2016    Priority: Low   Generalized anxiety disorder 01/18/2014    Priority: Low   Past Surgical History:  Procedure Laterality Date   ADENOIDECTOMY     ankle sugery     FRACTURE SURGERY     TYMPANOSTOMY     1 set    Family History  Problem Relation Age of Onset   ADD / ADHD Mother    Allergic rhinitis Mother    Asthma Mother    Hypertension Father    Cancer Maternal Aunt        breast- late 11s   Cancer Maternal Grandmother        breast-late 40s    Medications- reviewed and  updated Current Outpatient Medications  Medication Sig Dispense Refill   albuterol  (VENTOLIN  HFA) 108 (90 Base) MCG/ACT inhaler Use 2 puffs every four hours as needed for cough or wheeze.  May use 2 puffs 10-20 minutes prior to exercise. 6.7 g 1   ALPRAZolam (XANAX) 0.5 MG tablet Take 0.5 mg by mouth as needed.     amphetamine -dextroamphetamine  (ADDERALL  XR) 20 MG 24 hr capsule Take 2 capsules (40 mg total) by mouth in the morning. 180 capsule 0   amphetamine -dextroamphetamine  (ADDERALL ) 20 MG tablet Take 1 tablet (20 mg total) by mouth daily. 90 tablet 0   Ascorbic Acid (VITAMIN C PO) Take by mouth.     fluticasone  furoate-vilanterol (BREO ELLIPTA ) 100-25 MCG/ACT AEPB Inhale 1 puff into the lungs daily. 60 each 5   loratadine (CLARITIN) 10 MG tablet Take 10 mg by mouth daily.     Omega-3 Fatty Acids (FISH OIL MAXIMUM STRENGTH) 1200 MG CAPS Take 1 capsule by mouth every other day.     traZODone  (DESYREL ) 50 MG tablet Take 1-3 tablets (50-150 mg total) by mouth at bedtime. 270 tablet 3   Vilazodone  HCl (VIIBRYD ) 10 MG TABS Take 1 tablet (10 mg total) by mouth daily with full meal. 90 tablet 1   No current facility-administered medications for this visit.    Allergies-reviewed and updated No  Known Allergies  Social History   Social History Narrative   Married august 2020 on the beach at [place].  She reports that Dr. Katrinka sees her husband.      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       Hobbies: enjoys formula [employer] racing   Objective  Objective:  BP 122/88 (BP Location: Left Arm, Patient Position: Sitting, Cuff Size: Normal)   Pulse 99   Temp 98.8 F (37.1 C) (Temporal)   Ht 5' 7.13 (1.705 m)   Wt 159 lb 3.2  oz (72.2 kg)   LMP 07/10/2024 (Exact Date)   SpO2 99%   BMI 24.84 kg/m  Gen: NAD, resting comfortably HEENT: Mucous membranes are moist. Oropharynx normal Neck: no thyromegaly CV: RRR no murmurs rubs or gallops Lungs: CTAB no crackles, wheeze, rhonchi Abdomen: soft/nontender/nondistended/normal bowel sounds. No rebound or guarding.  Ext: no edema Skin: warm, dry Neuro: grossly normal, moves all extremities, PERRLA   Assessment and Plan   33 y.o. female presenting for annual physical.  Health Maintenance counseling: 1. Anticipatory guidance: Patient counseled regarding regular dental exams -q6 months, eye exams - yearly,  avoiding smoking and second hand smoke - has vaped at times but nothing consistent, limiting alcohol to 1 beverage per day- 3 a week , no illicit drugs.   2. Risk factor reduction:  Advised patient of need for regular exercise and diet rich and fruits and vegetables to reduce risk of heart attack and stroke.  Exercise- ball room dancing once a week and trying to get back on bike.  Diet/weight management-Down 4 pounds from last year-very healthy weight. Trying to eat well with husband- feels could reduce sugar  Wt Readings from Last 3 Encounters:  07/26/24 159 lb 3.2 oz (72.2 kg)  11/01/23 153 lb 3.2 oz (69.5 kg)  10/27/23 155 lb 3.3 oz (70.4 kg)  3. Immunizations/screenings/ancillary studies-flu shot- today, Prevnar 20- today, COVID-19 vaccination holding off  Immunization History  Administered Date(s) Administered   Hepatitis B, ADULT 10/11/2013, 11/09/2013, 04/16/2014   Hpv-Unspecified 04/04/2006, 12/10/2008, 04/11/2009   Influenza,inj,Quad PF,6+ Mos 09/25/2013, 08/29/2023   Influenza-Unspecified 09/07/2015, 09/02/2017, 08/17/2018, 08/25/2020   MMR 10/11/2013   PFIZER(Purple Top)SARS-COV-2 Vaccination 11/14/2019, 12/05/2019, 10/02/2020   PPD Test 09/25/2013, 10/22/2013, 03/18/2015, 04/16/2015, 04/09/2016   Tdap 09/27/2013, 12/12/2021   Varicella 03/18/2015   4. Cervical cancer screening-  [facility] in past-reports history of abnormal Pap and had another abnormal exam last year so needs yearly but plans to schedule with new provider as hers left. We still need a copy 5. Breast cancer screening-  breast exam with GYN and mammogram - will start likely yearly at 42. Maternal aunt and grandma in late 36s- baseline age 34  6. Colon cancer screening - no family history, start at age 86  7. Skin cancer screening- refer for dermatology - has not seen recently. advised regular sunscreen use. Denies worrisome, changing, or new skin lesions.   8. Birth control/STD check- family planning, only active with spouse - no std screen  9. Osteoporosis screening at 65- plan later  10. Smoking associated screening - never smoker- has vaped in  past and encouraged against   Status of chronic or acute concerns   # Asthma-sees Dr. Gallagher Allergies-maintains on loratadine  S: Maintenance Medication: Breo 100-25 mcg 1 puff daily As needed medication: albuterol . Patient is using this almost never- sometimes if out in humidity.  A/P: doing well- continue current medications    # Generalized anxiety disorder-managed by Dr. Vincente RAMAN: Medication: vilazodone  10 mg- primarily cyclical with hormonal shifts for 2 weeks- ok per Dr. Vincente, trazodone  50 mg for sleep -sees therapy A/P: reports working well for her- continue current medications and follow up with Dr. Vincente    # ADD-followed Dr. Vincente S:control: reasonable medication: Adderall  20 mg extended release A/P: reasonable control- continue current medications    # Salmonella in December 2024 was appropriately treated-no persistent issues thankfully - didn't have to use the ciprofloxacin   #rash- rash perhaps a year intermittent- had lupus and autoimmune workup- triggers stress and heat and sometimes alcohol- does not want to try elimination diet  - did some food allergy  testing with Dr. Iva- wants to  monitor for now   Recommended follow up: Return in about 1 year (around 07/26/2025) for physical or sooner if needed.Schedule b4 you leave. Future Appointments  Date Time Provider Department Center  11/01/2024  8:30 AM Iva Marty Saltness, MD [facility] None   Lab/Order associations: fasting   ICD-10-CM   1. Preventative health care  Z00.00     2. Need for influenza vaccination  Z23     3. Need for pneumococcal 20-valent conjugate vaccination  Z23       No orders of the defined types were placed in this encounter.   Return precautions advised.  Garnette Lukes, MD

## 2024-08-16 ENCOUNTER — Other Ambulatory Visit (HOSPITAL_BASED_OUTPATIENT_CLINIC_OR_DEPARTMENT_OTHER): Payer: Self-pay

## 2024-08-16 ENCOUNTER — Other Ambulatory Visit: Payer: Self-pay | Admitting: Allergy & Immunology

## 2024-08-16 MED ORDER — FLUTICASONE FUROATE-VILANTEROL 100-25 MCG/ACT IN AEPB
1.0000 | INHALATION_SPRAY | Freq: Every day | RESPIRATORY_TRACT | 5 refills | Status: AC
  Filled 2024-08-16: qty 60, 30d supply, fill #0
  Filled 2024-09-24: qty 60, 30d supply, fill #1

## 2024-08-19 ENCOUNTER — Encounter: Payer: Self-pay | Admitting: Family Medicine

## 2024-08-20 ENCOUNTER — Other Ambulatory Visit (HOSPITAL_BASED_OUTPATIENT_CLINIC_OR_DEPARTMENT_OTHER): Payer: Self-pay

## 2024-09-13 DIAGNOSIS — N943 Premenstrual tension syndrome: Secondary | ICD-10-CM | POA: Diagnosis not present

## 2024-09-13 DIAGNOSIS — F411 Generalized anxiety disorder: Secondary | ICD-10-CM | POA: Diagnosis not present

## 2024-09-13 DIAGNOSIS — F900 Attention-deficit hyperactivity disorder, predominantly inattentive type: Secondary | ICD-10-CM | POA: Diagnosis not present

## 2024-09-13 DIAGNOSIS — F5101 Primary insomnia: Secondary | ICD-10-CM | POA: Diagnosis not present

## 2024-09-20 ENCOUNTER — Other Ambulatory Visit (HOSPITAL_BASED_OUTPATIENT_CLINIC_OR_DEPARTMENT_OTHER): Payer: Self-pay

## 2024-09-21 ENCOUNTER — Other Ambulatory Visit: Payer: Self-pay

## 2024-09-21 ENCOUNTER — Other Ambulatory Visit (HOSPITAL_BASED_OUTPATIENT_CLINIC_OR_DEPARTMENT_OTHER): Payer: Self-pay

## 2024-09-21 MED ORDER — AMPHETAMINE-DEXTROAMPHETAMINE 20 MG PO TABS
20.0000 mg | ORAL_TABLET | Freq: Every day | ORAL | 0 refills | Status: AC
  Filled 2024-09-21: qty 90, 90d supply, fill #0

## 2024-09-21 MED ORDER — AMPHETAMINE-DEXTROAMPHET ER 20 MG PO CP24
40.0000 mg | ORAL_CAPSULE | Freq: Every morning | ORAL | 0 refills | Status: AC
  Filled 2024-09-21: qty 180, 90d supply, fill #0

## 2024-09-24 ENCOUNTER — Other Ambulatory Visit: Payer: Self-pay | Admitting: Allergy & Immunology

## 2024-09-24 ENCOUNTER — Other Ambulatory Visit (HOSPITAL_BASED_OUTPATIENT_CLINIC_OR_DEPARTMENT_OTHER): Payer: Self-pay

## 2024-09-24 MED ORDER — ALBUTEROL SULFATE HFA 108 (90 BASE) MCG/ACT IN AERS
INHALATION_SPRAY | RESPIRATORY_TRACT | 1 refills | Status: AC
  Filled 2024-09-24: qty 6.7, 17d supply, fill #0

## 2024-09-28 ENCOUNTER — Other Ambulatory Visit (HOSPITAL_BASED_OUTPATIENT_CLINIC_OR_DEPARTMENT_OTHER): Payer: Self-pay

## 2024-10-23 DIAGNOSIS — Z124 Encounter for screening for malignant neoplasm of cervix: Secondary | ICD-10-CM | POA: Diagnosis not present

## 2024-10-23 DIAGNOSIS — Z1151 Encounter for screening for human papillomavirus (HPV): Secondary | ICD-10-CM | POA: Diagnosis not present

## 2024-10-23 DIAGNOSIS — Z01419 Encounter for gynecological examination (general) (routine) without abnormal findings: Secondary | ICD-10-CM | POA: Diagnosis not present

## 2024-10-23 DIAGNOSIS — R888 Abnormal findings in other body fluids and substances: Secondary | ICD-10-CM | POA: Diagnosis not present

## 2024-11-01 ENCOUNTER — Ambulatory Visit: Payer: 59 | Admitting: Allergy & Immunology

## 2024-12-25 ENCOUNTER — Other Ambulatory Visit: Payer: Self-pay

## 2024-12-25 ENCOUNTER — Encounter: Payer: Self-pay | Admitting: Allergy & Immunology

## 2024-12-25 ENCOUNTER — Ambulatory Visit: Admitting: Allergy & Immunology

## 2024-12-25 VITALS — BP 128/92 | HR 98 | Temp 98.5°F | Ht 67.25 in | Wt 159.1 lb

## 2024-12-25 DIAGNOSIS — J302 Other seasonal allergic rhinitis: Secondary | ICD-10-CM | POA: Diagnosis not present

## 2024-12-25 DIAGNOSIS — J4540 Moderate persistent asthma, uncomplicated: Secondary | ICD-10-CM

## 2024-12-25 DIAGNOSIS — J3089 Other allergic rhinitis: Secondary | ICD-10-CM

## 2024-12-25 DIAGNOSIS — R21 Rash and other nonspecific skin eruption: Secondary | ICD-10-CM | POA: Diagnosis not present

## 2024-12-25 NOTE — Patient Instructions (Addendum)
 1. Moderate persistent asthma, uncomplicated - Lung testing looks great today.  - Let's stay off of the controller medication.  - I am glad that things are headed the right direction.  - Daily controller medication(s): NOTHING  - Prior to physical activity: albuterol  2 puffs 10-15 minutes before physical activity. - Rescue medications: albuterol  4 puffs every 4-6 hours as needed - Asthma control goals:  * Full participation in all desired activities (may need albuterol  before activity) * Albuterol  use two time or less a week on average (not counting use with activity) * Cough interfering with sleep two time or less a month * Oral steroids no more than once a year * No hospitalizations  2. Perennial allergic rhinitis - Latest testing showed: indoor molds, outdoor molds, dust mites, cat, and cockroach - Continue with Claritin 10mg  daily as you are doing. - You can alternate antihistamines if you feel that one is not working as effectively.  3. Return in about 1 year (around 12/25/2025). You can have the follow up appointment with Dr. Iva or a Nurse Practicioner (our Nurse Practitioners are excellent and always have Physician oversight!).    Please inform us  of any Emergency Department visits, hospitalizations, or changes in symptoms. Call us  before going to the ED for breathing or allergy  symptoms since we might be able to fit you in for a sick visit. Feel free to contact us  anytime with any questions, problems, or concerns.  It was a pleasure to see you again today!  Websites that have reliable patient information: 1. American Academy of Asthma, Allergy , and Immunology: www.aaaai.org 2. Food Allergy  Research and Education (FARE): foodallergy.org 3. Mothers of Asthmatics: http://www.asthmacommunitynetwork.org 4. [employer] of Allergy , Asthma, and Immunology: www.acaai.org      Like us  on [employer] and Instagram for our latest updates!      A healthy democracy works  best when [employer] participate! Make sure you are registered to vote! If you have moved or changed any of your contact information, you will need to get this updated before voting! Scan the QR codes below to learn more!

## 2024-12-26 ENCOUNTER — Encounter: Payer: Self-pay | Admitting: Allergy & Immunology

## 2025-03-06 ENCOUNTER — Ambulatory Visit: Admitting: Dermatology

## 2025-08-01 ENCOUNTER — Encounter: Admitting: Family Medicine

## 2025-12-24 ENCOUNTER — Ambulatory Visit: Admitting: Allergy & Immunology
